# Patient Record
Sex: Female | Born: 1990 | Race: White | Hispanic: No | Marital: Single | Smoking: Former smoker
Health system: Southern US, Community
[De-identification: ages and names within clinical notes are randomized; demographics above are authoritative.]

## PROBLEM LIST (undated history)

## (undated) DIAGNOSIS — G039 Meningitis, unspecified: Secondary | ICD-10-CM

## (undated) HISTORY — PX: CHOLECYSTECTOMY: SHX55

## (undated) HISTORY — PX: TONSILLECTOMY: SUR1361

---
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
## 2010-01-10 ENCOUNTER — Emergency Department (HOSPITAL_COMMUNITY): Admission: EM | Admit: 2010-01-10 | Discharge: 2010-01-10 | Payer: Self-pay | Admitting: Emergency Medicine

## 2011-03-01 LAB — DIFFERENTIAL
Basophils Absolute: 0 10*3/uL (ref 0.0–0.1)
Basophils Relative: 0 % (ref 0–1)
Eosinophils Absolute: 0 10*3/uL (ref 0.0–0.7)
Eosinophils Relative: 0 % (ref 0–5)
Monocytes Absolute: 0.3 10*3/uL (ref 0.1–1.0)

## 2011-03-01 LAB — CBC
HCT: 38.3 % (ref 36.0–46.0)
Hemoglobin: 13.4 g/dL (ref 12.0–15.0)
MCHC: 34.8 g/dL (ref 30.0–36.0)
MCV: 87.6 fL (ref 78.0–100.0)
RDW: 12.1 % (ref 11.5–15.5)

## 2011-03-01 LAB — URINALYSIS, ROUTINE W REFLEX MICROSCOPIC
Bilirubin Urine: NEGATIVE
Hgb urine dipstick: NEGATIVE
Specific Gravity, Urine: 1.015 (ref 1.005–1.030)
Urobilinogen, UA: 0.2 mg/dL (ref 0.0–1.0)

## 2011-03-01 LAB — BASIC METABOLIC PANEL
CO2: 29 mEq/L (ref 19–32)
Calcium: 8.8 mg/dL (ref 8.4–10.5)
Glucose, Bld: 107 mg/dL — ABNORMAL HIGH (ref 70–99)
Potassium: 4.1 mEq/L (ref 3.5–5.1)
Sodium: 136 mEq/L (ref 135–145)

## 2011-05-01 NOTE — Op Note (Signed)
Brittany [name], Brittany [name]               ACCOUNT NO.:  [id number]   MEDICAL RECORD NO.:  [id number]            PATIENT TYPE:   LOCATION:                                 FACILITY:   PHYSICIAN:  Barbaraann Barthel, M.D.      DATE OF BIRTH:   DATE OF PROCEDURE:  10/03/2004  DATE OF DISCHARGE:                                 OPERATIVE REPORT   PREOPERATIVE DIAGNOSIS:  Cholecystitis secondary to biliary dyskinesia.   POSTOPERATIVE DIAGNOSIS:  Cholecystitis secondary to biliary dyskinesia.   PROCEDURE:  Laparoscopic cholecystectomy.   SURGEON:  Marlane Hatcher, M.D.   SPECIMEN:  Gallbladder.   NOTE:  This is a 20 year old white female who had recurrent episodes of  postprandial right upper quadrant pain radiating to her back accompanied  with nausea and vomiting.  She was seen by the GI service at [facility] which revealed no cholelithiasis and a hepatobiliary scan  which suggested biliary dyskinesia she was referred to surgery for a  laparoscopic cholecystectomy.  Plans are to follow up with the GI service at  [facility] for upper GI endoscopy should she continue  to have dyspepsia.   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  The right upper quadrant otherwise appeared to be normal.  A  small cystic duct which was not cannulated for a cholangiogram.  Right upper  quadrant otherwise appeared to be normal.   TECHNIQUE:  The patient was placed in the supine position and after the  adequate administration of general anesthesia via endotracheal intubation  her entire abdomen was prepped with Betadine solution and draped in the  usual manner.  Prior to this a Foley catheter was aseptically inserted.  A  transverse incision was carried out over the umbilicus and the fascia was  grasped with a sharp towel clip and elevated.  A Veress  needle was inserted  after confirmed in position with a saline drop test.  The abdomen was then  insufflated with approximately 3.5 liters of CO2; then an 11-mm [place] [employer]  cannula was placed on the umbilicus incision using the Vis-A-Port technique.   Then, under direct vision, 3 other cannulas an 11-mm cannula in the  epigastrium and two 5-mm cannulas in right upper quadrant laterally.  The  gallbladder was grasped.  Its adhesions were taken down.  The cystic duct  was clearly visualized, triply silver clipped on the side of the common bile  duct, and singly silver clipped on the side of the gallbladder.  The cystic  artery was likewise triply silver clipped and divided.  The gallbladder was  then removed uneventfully from the hepatic bed using the hook cautery  device.  This was no spillage and there were no complications.   The gallbladder bed was lightly cauterized.  I elected to leave a piece of  Surgicel in the liver bed as well.  The gallbladder was then exited using an  EndoCatch device through the epigastric incision.  After irrigating and  checking for hemostasis the abdomen was then desufflated and the fascia was  closed in the area of the umbilicus using #0 Polysorb sutures and all skin  incisions were closed using the stapling device.   Prior to closure, all sponge, needle, and instrument counts were found to be  correct.  Estimated blood loss was minimal.  I placed approximately 1/2%  Sensorcaine around the port sites for added comfort.  There were no  complications.     Will   WB/MEDQ  D:  10/03/2004  T:  10/03/2004  Job:  [id number]   cc:   Barbaraann Barthel, M.D.  [address]  [place]  [place] [address]  Fax: [phone]   Scott A. Gerda Diss, MD  [address]  [place]  [place] [address]  Fax: [phone]

## 2011-05-01 NOTE — Op Note (Signed)
[name], [name]               ACCOUNT NO.:  [id number]   MEDICAL RECORD NO.:  [id number]          PATIENT TYPE:  OIB   LOCATION:  2899                         FACILITY:  [facility]   PHYSICIAN:  Jon Gills, M.D.  DATE OF BIRTH:  [birth year]   DATE OF PROCEDURE:  10/27/2004  DATE OF DISCHARGE:  10/24/2004                                 OPERATIVE REPORT   PREOPERATIVE DIAGNOSIS:  Hematemesis and abdominal pain.   POSTOPERATIVE DIAGNOSIS:  Hematemesis and abdominal pain.   OPERATION PERFORMED:  Upper GI endoscopy with biopsies.   SURGEON:  Jon Gills, M.D.   ASSISTANT:  None.   ANESTHESIA:  General.   DESCRIPTION OF PROCEDURE:  Following informed written consent, the patient  was taken to the operating room and placed under general anesthesia with  continuous cardiopulmonary monitoring.  She remained in the supine position  and the Olympus endoscope was advanced by mouth without difficulty.  Examination of the esophagus was essentially unremarkable although the  distal folds were somewhat thickened and at least one linear erosion was  present.  No abnormalities were seen in the stomach and duodenum. Multiple  biopsies were obtained and submitted for histologic review.  A solitary  gastric biopsy was also obtained to look for Helicobacter pylori.  It was  also my impression that the gastric folds were somewhat hyperemic and  edematous but not actively inflamed.  The endoscope was gradually withdrawn  and Takoya was awakened and taken to the recovery room in satisfactory  condition.  She will be released layer today in the company of her mother.   INSTRUMENT USED:  Olympus GIF-140 endoscope with cold biopsy forceps.   SPECIMENS:  Esophagus times three in [place].  Stomach times two in  [place].  Stomach times one for CLO testing.  Duodenum times three in  [place].       JHC/MEDQ  D:  10/27/2004  T:  10/27/2004  Job:  [id number]   cc:   Lorin Picket A. Gerda Diss, MD  [address], [address]  [place]  [place] [address]  Fax: [phone]

## 2011-05-01 NOTE — Discharge Summary (Signed)
NAME:  Brittany [name], Brittany [name]                         ACCOUNT NO.:  [id number]   MEDICAL RECORD NO.:  [id number]                   PATIENT TYPE:  INP   LOCATION:  A316                                 FACILITY:  [facility]   PHYSICIAN:  Scott A. Gerda Diss, M.D.               DATE OF BIRTH:  January 07, 1991   DATE OF ADMISSION:  01/09/2004  DATE OF DISCHARGE:  01/10/2004                                 DISCHARGE SUMMARY   DISCHARGE DIAGNOSIS:  Gastroenteritis.   HOSPITAL COURSE:  The patient was admitted in with abdominal pain and  vomiting with severe discomfort unable to calm the pain.  While in the ER,  he had a CT scan treated with pain medicine still unable to keep anything  down.  Therefore, she was admitted.  She was placed on IV medication for  nausea along with medication for pain and discomfort.  She was covered with  antibiotics.  Urine culture was ordered.  The following day, the patient was  doing better and able to take things well.  Repeat blood count showed  hemoglobin 12.8, white count 9.2.  Urine pregnancy was negative.  X-ray  overall was negative.  The patient was stable for discharge and discharged  to home on January 27, without difficulty.   DISCHARGE MEDICATIONS:  1. Phenergan.  2. Vicodin.   FOLLOW UP:  Follow up in one week and sooner if any problems.     ___________________________________________                                         Jonna Coup Gerda Diss, M.D.   Linus Orn  D:  01/23/2004  T:  01/23/2004  Job:  [id number]

## 2011-05-01 NOTE — H&P (Signed)
Brittany [name], Brittany [name]                         ACCOUNT NO.:  [id number]   MEDICAL RECORD NO.:  [id number]                   PATIENT TYPE:  EMS   LOCATION:  ED                                   FACILITY:  [facility]   PHYSICIAN:  Scott A. Gerda Diss, M.D.               DATE OF BIRTH:  1991/01/08   DATE OF ADMISSION:  01/09/2004  DATE OF DISCHARGE:                                HISTORY & PHYSICAL   CHIEF COMPLAINT:  Abdominal pain.   HISTORY OF PRESENT ILLNESS:  This 20 year old white female who awoke this  morning with severe abdominal pain, vomiting, inability to keep anything  down, severe pain on the right side, moaning, crying in discomfort, came to  the emergency department and was suspected for the possibility of a kidney  stone versus appendicitis versus ruptured cyst.  A CT scan of the abdomen  and pelvis was ordered and the patient was treated with appropriate pain  medication.  Unable to get pain under control with pain medication.  The  patient continued vomiting.  Therefore, we are consulted.   PAST MEDICAL HISTORY:  The patient has had problems with rectal bleeding in  the past.  She was seen by [facility], then seen at [facility] as well.  It  is felt that it is most likely due to a perianal fissure or inflammatory  polyp.  The patient also had an admission back in August 2004 of  gastroenteritis, then spent a day in the hospital.  PMH benign otherwise.   ALLERGIES:  Questionable if TYLENOL caused a rash or vomiting.   FAMILY HISTORY:  Colon cancer, renal cancer, gestational diabetes.   SOCIAL HISTORY:  Unknown if the patient smokes.  She is drowsy from the  medication currently.   PHYSICAL EXAMINATION:  GENERAL:  Looks to feel ill.  HEENT:  T-NL.  Neck is supple.  TMs NL.  CHEST:  CTA.  HEART:  Regular.  ABDOMEN:  Soft with moderate right lower quadrant and right mid lateral  quadrant tenderness.  No guarding nor rebound.  EXTREMITIES:  No edema.  NEUROLOGIC: Grossly  normal.   UA 7-10 wbc's.  White count 8.8.  MET-7 noted.  CT scan normal appearance of  the appendix and kidneys and ureters.  No hydronephrosis or stone seen.  No  GYN masses seen.  Increased enlargement in the mesenteric nodes consistent  with mesenteric adenitis.   ASSESSMENT AND PLAN:  Abdominal pain - most likely a viral process with  mesenteric adenitis.  I doubt kidney stones.  There could be a UTI going on.  We will culture the urine and go ahead and treat with IV antibiotics.  The  patient's pain is unable to be controlled and therefore we will go ahead and  admit the patient for IV fluids and pain control.  Expect 1-3 days in the  hospital.     ___________________________________________  Scott A. Gerda Diss, M.D.   Linus Orn  D:  01/09/2004  T:  01/09/2004  Job:  [id number]

## 2011-05-01 NOTE — Procedures (Signed)
REFERRING PHYSICIAN:  Scott A. Gerda Diss, M.D.   CLINICAL HISTORY:  A 21 year old girl with episode of passing out. EEG is  being evaluated for seizure activity.   MEDICATIONS:  1. Darvocet.  2. Doxycycline.  3. Birth control pills.   This is a routine EEG recording performed using 17-channel and standard  10/20 electrode placement.   Background rhythm consists of 9 to 10 hertz alpha which was of moderate  amplitude, reactive, synchronous to eye opening and closure. Moderate amount  of low amplitude high frequency beta activity is seen throughout the  recording. No definite epileptiform activity spikes or sharp waves are  noted. Hyperventilation resulted in no significant abnormalities.  Intermittent photic stimulation is unremarkable. Sleep stages are not seen  except for mild drowsiness which is uneventful. Length of the tracing is 22  minutes, 11 seconds. Technical component is adequate. EKG tracing reveals  regular rate and rhythm.   IMPRESSION:  Procedure performed during wakeful state and drowsiness is  within normal limits. No definite epileptiform is identified. If seizures  are suspected, repeat sleep-deprived study may be beneficial.    PRAMOD Lina Sayre, MD   EAV:[id number]  D:  07/31/2004 18:00:23  T:  08/01/2004 15:21:56  Job #:  [id number]

## 2011-05-01 NOTE — Discharge Summary (Signed)
   NAME:  Brittany [name], Brittany [name]                         ACCOUNT NO.:  [id number]   MEDICAL RECORD NO.:  [id number]                   PATIENT TYPE:  OBV   LOCATION:  A328                                 FACILITY:  [facility]   PHYSICIAN:  Scott A. Gerda Diss, M.D.               DATE OF BIRTH:  [birth year]   DATE OF ADMISSION:  07/31/2003  DATE OF DISCHARGE:  08/01/2003                                 DISCHARGE SUMMARY   CHIEF COMPLAINT:  Nausea, vomiting and diarrhea.   HISTORY OF PRESENT ILLNESS:  This 20 year old, white female was admitted  into the hospital after failing outpatient management.  She was seen the day  previous to coming in on August 16, and she was treated at that time with  Phenergan.  The patient was unable to take any particular medications at  that time because of unable to get the prescription filled.  As a result of  this, the patient was then treated with Phenergan here in the hospital along  with IV fluids and the patient improved gradually over the course of the  next 48 hours.  In addition to this, on the morning of August 18, the  patient was able to keep liquids down, dry cereal and toast.  It was felt  that the patient was stable for discharge.  Her repeat blood work overall  looked good.  The patient did not have any particular troubles otherwise.  There were no fevers.  Exams at both admission and discharge were overall  acceptable with an improvement in the abdominal discomfort at the time of  discharge.   PHYSICAL EXAMINATION:  HEENT:  Benign.  TMs are benign.  Mucous membranes  tacky.  NECK:  Supple.  LUNGS:  Clear.  HEART:  Regular.  ABDOMEN:  Soft, positive bowel sounds.  VITAL SIGNS:  Positive orthostasis in the office with systolic dropping 20  points.  EXTREMITIES:  No edema.  NEUROLOGIC:  Grossly normal.   LABORATORY DATA AND X-RAY FINDINGS:  On admission, laboratory showed a  sodium of 133 and on discharge 135, BUN, creatinine and bicarb were  acceptable.  White count normal.   ASSESSMENT:  The patient has viral gastroenteritis and should gradually  improve, but she is at the point now where she can be discharged from  observation.                                               Scott A. Gerda Diss, M.D.    SAL/MEDQ  D:  08/01/2003  T:  08/01/2003  Job:  [id number]

## 2011-05-01 NOTE — Discharge Summary (Signed)
NAME:  Brittany [name], Brittany [name]                         ACCOUNT NO.:  [id number]   MEDICAL RECORD NO.:  [id number]                   PATIENT TYPE:  INP   LOCATION:  A315                                 FACILITY:  [facility]   PHYSICIAN:  Scott A. Gerda Diss, M.D.               DATE OF BIRTH:  December 02, 1991   DATE OF ADMISSION:  07/26/2004  DATE OF DISCHARGE:  07/29/2004                                 DISCHARGE SUMMARY   DISCHARGE DIAGNOSIS:  Aseptic meningitis, probably viral.   HISTORY OF PRESENT ILLNESS AND HOSPITAL COURSE:  This 20 year old white  female was admitted in with a one-day history of dizziness, severe headache,  nuchal rigidity and was evaluated properly in the emergency department.  She  had an LP done which did not show any RBCs but did show 22 WBCs.  Her  glucose and protein counts were normal.  Because of the severity of her  headache and WBCs seen, she was admitted in an placed on IV antibiotics  while all of this was pending.  Cultures came back showing no growth of  bacteria, but because of persistent problems for the patient, she was  continued on pain medication all the way through the morning of the 16th.  She did have a little bit of a rash that occurred after receiving some  morphine and receiving some Rocephin.  It was hard to know which one caused  it, so both of them were stopped.  She was switched over to Dilaudid.  Vancomycin was tolerated without problem, but the Dilaudid may have caused a  rash too.  None of these were severe rashes, none were hives.  I recommended  for the patient to just stay away from those type of medications, but  somewhere down the road she would have to be watched closely if she ever had  surgery or received pain medicine.   DISCHARGE MEDICATIONS:  1. Darvocet one q.4h. p.r.n. pain.  Caution drowsiness.  2. Doxycycline 100 mg, one b.i.d. for seven days.   FOLLOW UP:  She is to follow up in the office in one week.  She is to have  an EEG as an  outpatient at [facility] on 07/31/2004 at 8 a.m.     ___________________________________________                                         Lorin Picket A. Gerda Diss, M.D.   SAL/MEDQ  D:  07/29/2004  T:  07/29/2004  Job:  [id number]

## 2011-05-01 NOTE — H&P (Signed)
NAMEPEARLIE, [name]                         ACCOUNT NO.:  [id number]   MEDICAL RECORD NO.:  [id number]                   PATIENT TYPE:  INP   LOCATION:  A315                                 FACILITY:  [facility]   PHYSICIAN:  Jeoffrey Massed, M.D.             DATE OF BIRTH:  Aug 07, 1991   DATE OF ADMISSION:  07/26/2004  DATE OF DISCHARGE:                                HISTORY & PHYSICAL   CHIEF COMPLAINT:  Passing out and headache.   HISTORY OF PRESENT ILLNESS:  Brittany [name] is a 20 year old white female who was  brought to the emergency department today about 3:00 o'clock after she  passed out at home. She initially began feeling a headache the night prior  to presentation that started in the back of her head and then spread to  cover her entire head within about 5 minutes. It was 8 out of 10 in  intensity and was not associated with any abnormal visual or neurologic  sensations, nausea, or photophobia. She had trouble going to sleep but  eventually did get to sleep and when she woke up, she did not have a  headache. She did note that all day today, she had been feeling dizzy and  off balance and nauseous. About 2:00 p.m. today, while in the kitchen, she  noted worsening of her nausea and dizziness and felt like things were going  in slow motion and noted that her heartbeat was fast and that is the last  thing that she remembers. Apparently her younger cousin witnessed her fall  and passing out but the cousin is not here for interview. The cousin called  the patient's mother and the mother arrived within 5 minutes of the passing  out. Apparently the cousin who witnessed the event said that Nashaly's  eyelids were fluttering and that her eyes were rolled straight back and she  was not breathing for 30 seconds. By the time the mother arrived, after  about 5 minutes, Orville apparently had her eyes wide open but would not  respond to her voice for approximately 10 to 15 minutes. When she  did  respond, eventually she was dazed and disoriented and kept insisting that  she was very hot. She then remembers waking up in the ambulance with a  severe headache, with photophobia and dizziness but no nausea. In the  emergency department, she underwent a lumbar puncture and this revealed 20  white blood cells and no red blood cells. I was called for further  evaluation and admission to the hospital for possible meningitis.   REVIEW OF SYSTEMS:  Two days ago, had a couple of episodes of emesis, which  apparently is not an entirely abnormal thing for her. No recent fever. No  recent foreign travel. NO recent tic bites. NO rash. No sore throat, upper  respiratory infection, cough, or abdominal pain. No swelling of extremities  and no vision or hearing changes.  She did have a period of chest pain and  shortness of breath in the emergency department, which apparently was short  lived.   PAST MEDICAL HISTORY:  Of note, denies any significant past medical history  of headaches. She has history of chronic intermittent abdominal pain with  vomiting, which has required hospitalization and rehydration in the past.  Abdominal and pelvic CT has been negative in the past. Ultrasound of the  abdomen has also been negative in the past. 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 She sees  [facility] for this.   PAST SURGICAL HISTORY:  Tonsillectomy and adenoidectomy in 2002.   MEDICATIONS:  1. Ortho-Tri-Cyclen low.  2. Vicodin 5/500 p.r.n. back pain.   ALLERGIES:  No known drug allergies.   SOCIAL HISTORY:  She lives in [place] with her mother and 83 year old  sister. She goes to [employer] and her biggest event this  summer has been going to [employer] in July. Her parents are divorced and her  dad lives in [place]. None of her family has been sick recently, with the  exception of her mother's cousin who died of  meningitis 3 weeks ago at age  60. Ryllie does not use tobacco, alcohol, or drugs.   FAMILY HISTORY:  A 46 year old sister with a seizure disorder and a first  cousin with a seizure disorder. No migraine headaches.   PHYSICAL EXAMINATION:  VITAL SIGNS:  Temperature in the emergency department  97.1 to 98.3 orally. Pulse 81 to 96. Blood pressure 108 to 127 over 64 to  78. Respiratory rate 18 and O2 saturation 96% to 98% on room air.  GENERAL:  On my examination, she is sedated but arousable. She is oriented  to person, place, and time and situation. She answers questions clearly. She  is in no acute distress. She is lying in a darkened examination room with a  wet towel over her forehead.  HEENT:  Pupils are equal and reactive to light and accommodation. Red  reflexes are symmetric. Extraocular movements are intact. Tympanic membranes  reveal light reflex and landmarks bilaterally. Nasal passages are patent.  Oropharynx reveals pink, moist mucosa without lesion, exudate or swelling.  The eyes are without icterus or injection or drainage. The patient has  tongue piercing.  NECK:  Supple without lymphadenopathy or thyromegaly. There is mild  tenderness in the occipital region, diffusely. There is no nuchal rigidity.  CHEST:  Clear to auscultation bilaterally. Breathing non-labored.  CARDIOVASCULAR:  Examination shows regular rate and rhythm without murmur,  rub, or gallop.  ABDOMEN:  Soft. Mildly tender diffusely without guarding or rebound. Bowel  sounds are normal active. There is no hepatosplenomegaly. No mass. She does  have a piercing in her umbilicus.  EXTREMITIES:  No clubbing, cyanosis, or edema.  SKIN:  No rash.  NEUROLOGIC:  Funduscopic examination:  The patient could not tolerate, but I  did glimpse some normal appearing vasculature on the retina bilaterally.  Optic disks could not be appreciated secondary to patient's non-compliance with maneuver. Cranial nerves 2-12 are  grossly intact bilaterally. Upper  extremity and lower extremity strength 5 out of 5 proximally and distally  bilaterally. No sensory deficits. Trace reflexes elicited bilaterally in the  biceps, triceps, patellar and Achilles regions. The patient would not  cooperate for cerebellar testing.   LABORATORY DATA:  CBC showed a white blood cell count of 5.2, hemoglobin  12.4, platelets  285,000. Differential showed 60% neutrophils, 30%  lymphocytes, 8% monocytes. PT was 12.5, INR 0.9. A basic metabolic panel  showed sodium 045, potassium 4.1, chloride 106, bicarb 26, BUN 6, creatinine  0.6, glucose 129, calcium 8.6. Cerebrospinal fluid studies showed glucose  59, cerebrospinal fluid protein 29. Cell count showed 0 red blood cells, 22  white blood cells, with no differential performed and there was no organism  seen on gram stain. The fluid was clear and colorless.   ASSESSMENT/PLAN:  Severe headache:  Possibilities include meningitis,  migraine headache, or increased intracranial pressure associated with mass.  Given the history, it is difficult to clearly say that this is a headache  associated with infection or the meninges. However, will treat with  antibiotics until the cultures are negative for at least 48 hours.  Additionally, given her syncopal episode and reported seizure-like activity  with confusion afterwards, I also feel like we should look into the  possibility of a seizure disorder. Will obtain  electroencephalogram and am presently considering neurologic imaging and  neurologic consultation. Will start morphine for pain, Phenergan for nausea  and give maintenance intravenous fluids and monitor for changes. For  antibiotics, will give Rocephin at 2 mg q. 12 hours for meningitic doses.     ___________________________________________                                         Jeoffrey Massed, M.D.   PHM/MEDQ  D:  07/26/2004  T:  07/27/2004  Job:  [id number]

## 2014-12-28 ENCOUNTER — Emergency Department (HOSPITAL_COMMUNITY)
Admission: EM | Admit: 2014-12-28 | Discharge: 2014-12-28 | Disposition: A | Discharge status: Home / Self Care | Payer: BLUE CROSS/BLUE SHIELD | Attending: Emergency Medicine | Admitting: Emergency Medicine

## 2014-12-28 ENCOUNTER — Emergency Department (HOSPITAL_COMMUNITY): Payer: BLUE CROSS/BLUE SHIELD

## 2014-12-28 ENCOUNTER — Encounter (HOSPITAL_COMMUNITY): Payer: Self-pay | Admitting: *Deleted

## 2014-12-28 DIAGNOSIS — Z720 Tobacco use: Secondary | ICD-10-CM | POA: Diagnosis not present

## 2014-12-28 DIAGNOSIS — Z8669 Personal history of other diseases of the nervous system and sense organs: Secondary | ICD-10-CM | POA: Diagnosis not present

## 2014-12-28 DIAGNOSIS — R55 Syncope and collapse: Secondary | ICD-10-CM | POA: Diagnosis not present

## 2014-12-28 DIAGNOSIS — R51 Headache: Secondary | ICD-10-CM | POA: Insufficient documentation

## 2014-12-28 DIAGNOSIS — R519 Headache, unspecified: Secondary | ICD-10-CM

## 2014-12-28 HISTORY — DX: Meningitis, unspecified: G03.9

## 2014-12-28 LAB — BASIC METABOLIC PANEL
Anion gap: 5 (ref 5–15)
BUN: 6 mg/dL (ref 6–23)
CALCIUM: 8.9 mg/dL (ref 8.4–10.5)
CHLORIDE: 106 meq/L (ref 96–112)
CO2: 28 mmol/L (ref 19–32)
CREATININE: 0.59 mg/dL (ref 0.50–1.10)
Glucose, Bld: 99 mg/dL (ref 70–99)
POTASSIUM: 3.8 mmol/L (ref 3.5–5.1)
SODIUM: 139 mmol/L (ref 135–145)

## 2014-12-28 LAB — CBC WITH DIFFERENTIAL/PLATELET
BASOS ABS: 0 10*3/uL (ref 0.0–0.1)
BASOS PCT: 0 % (ref 0–1)
EOS PCT: 1 % (ref 0–5)
Eosinophils Absolute: 0.2 10*3/uL (ref 0.0–0.7)
HEMATOCRIT: 43.2 % (ref 36.0–46.0)
Hemoglobin: 14 g/dL (ref 12.0–15.0)
LYMPHS ABS: 2.4 10*3/uL (ref 0.7–4.0)
Lymphocytes Relative: 17 % (ref 12–46)
MCH: 29.5 pg (ref 26.0–34.0)
MCHC: 32.4 g/dL (ref 30.0–36.0)
MCV: 91.1 fL (ref 78.0–100.0)
MONOS PCT: 4 % (ref 3–12)
Monocytes Absolute: 0.6 10*3/uL (ref 0.1–1.0)
NEUTROS ABS: 11.2 10*3/uL — AB (ref 1.7–7.7)
NEUTROS PCT: 78 % — AB (ref 43–77)
Platelets: 304 10*3/uL (ref 150–400)
RBC: 4.74 MIL/uL (ref 3.87–5.11)
RDW: 12.4 % (ref 11.5–15.5)
WBC: 14.4 10*3/uL — ABNORMAL HIGH (ref 4.0–10.5)

## 2014-12-28 MED ORDER — SODIUM CHLORIDE 0.9 % IV BOLUS (SEPSIS)
2000.0000 mL | Freq: Once | INTRAVENOUS | Status: AC
  Administered 2014-12-28: 1000 mL via INTRAVENOUS

## 2014-12-28 MED ORDER — METOCLOPRAMIDE HCL 5 MG/ML IJ SOLN
10.0000 mg | Freq: Once | INTRAMUSCULAR | Status: AC
  Administered 2014-12-28: 10 mg via INTRAVENOUS
  Filled 2014-12-28: qty 2

## 2014-12-28 MED ORDER — DIPHENHYDRAMINE HCL 50 MG/ML IJ SOLN
25.0000 mg | Freq: Once | INTRAMUSCULAR | Status: AC
  Administered 2014-12-28: 25 mg via INTRAVENOUS
  Filled 2014-12-28: qty 1

## 2014-12-28 NOTE — ED Provider Notes (Signed)
CSN: [id number]     Arrival date & time 12/28/14  1914 History   First MD Initiated Contact with Patient 12/28/14 1933     Chief Complaint  Patient presents with  . Loss of Consciousness  . Headache     (Consider location/radiation/quality/duration/timing/severity/associated sxs/prior Treatment) HPI  24 year old female presents after passing out at work. This morning around 9 AM the patient gave plasma. She has done this before. She was at work at [employer] and was at [employer] and felt like her whole body was getting hot, the world was going black, and her legs became weak. According to witnesses she fell to her knees but did not injure herself. She did seem to briefly pass out although did not hit her head. Since she woke up the patient has had a severe frontal headache. It feels like someone is sitting on her head. She's also having photophobia. Feels diffusely weak, no focal weakness noted. The patient has no neck stiffness. The patient never felt chest pain or shortness of breath. No headache prior to passing out. Rates the pain as severe. No abdominal pain or urinary symptoms. 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  Past Medical History  Diagnosis Date  . Meningitis    Past Surgical History  Procedure Laterality Date  . Cholecystectomy    . Tonsillectomy     Family History  Problem Relation Age of Onset  . Heart attack Sister   . Heart attack Maternal Aunt    History  Substance Use Topics  . Smoking status: Current Some Day Smoker  . Smokeless tobacco: Not on file  . Alcohol Use: Yes     Comment: socially   OB History    No data available     Review of Systems  Constitutional: Negative for fever.  Eyes: Positive for photophobia.  Respiratory: Negative for shortness of breath.   Cardiovascular: Negative for chest pain.  Gastrointestinal: Negative for vomiting and abdominal pain.   Genitourinary: Negative for dysuria.  Neurological: Positive for syncope, weakness and headaches.  All other systems reviewed and are negative.     Allergies  Doxycycline  Home Medications   Prior to Admission medications   Not on File   BP 119/63 mmHg  Pulse 87  Temp(Src) 98.2 F (36.8 C) (Oral)  Resp 16  Ht  (1.626 m)  Wt 184 lb (83.462 kg)  BMI 31.57 kg/m2  SpO2 97%  LMP 12/17/2014 Physical Exam  Constitutional: She is oriented to person, place, and time. She appears well-developed and well-nourished.  HENT:  Head: Normocephalic and atraumatic.  Right Ear: External ear normal.  Left Ear: External ear normal.  Nose: Nose normal.  Eyes: EOM are normal. Pupils are equal, round, and reactive to light. Right eye exhibits no discharge. Left eye exhibits no discharge.  photophobic  Neck: Normal range of motion. Neck supple.  Normal Passive ROM without pain or stiffness  Cardiovascular: Normal rate, regular rhythm and normal heart sounds.   Pulmonary/Chest: Effort normal and breath sounds normal.  Abdominal: Soft. She exhibits no distension. There is no tenderness.  Neurological: She is alert and oriented to person, place, and time.  CN 2-12 grossly intact. 5/5 strength in all 4 extremities  Skin: Skin is warm and dry.  Nursing note and vitals reviewed.   ED Course  Procedures (including critical care time) Labs Review Labs Reviewed  CBC WITH DIFFERENTIAL -  Abnormal; Notable for the following:    WBC 14.4 (*)    Neutrophils Relative % 78 (*)    Neutro Abs 11.2 (*)    All other components within normal limits  BASIC METABOLIC PANEL    Imaging Review Ct Head Wo Contrast  12/28/2014   CLINICAL DATA:  Headache, loss of consciousness.  EXAM: CT HEAD WITHOUT CONTRAST  TECHNIQUE: Contiguous axial images were obtained from the base of the skull through the vertex without intravenous contrast.  COMPARISON:  None.  FINDINGS: No intracranial hemorrhage, mass effect,  or midline shift. No hydrocephalus. The basilar cisterns are patent. No evidence of territorial infarct. No intracranial fluid collection. Mild patient motion artifact. Calvarium is intact. Included paranasal sinuses and mastoid air cells are well aerated.  IMPRESSION: No acute intracranial abnormality.   Electronically Signed   By: Rubye OaksMelanie  Ehinger M.D.   On: 12/28/2014 20:48     EKG Interpretation   Date/Time:  Friday December 28 2014 19:40:31 EST Ventricular Rate:  88 PR Interval:  139 QRS Duration: 84 QT Interval:  373 QTC Calculation: 451 R Axis:   66 Text Interpretation:  Normal sinus rhythm Normal ECG No old tracing to  compare Confirmed by Brynlyn Dade  MD, Cedar Roseman ([id number]) on 12/28/2014 8:24:11 PM      MDM   Final diagnoses:  Syncope, unspecified syncope type  Acute nonintractable headache, unspecified headache type    Patient symptoms significant improved with fluids and pain medicines. Given her severe headache after syncope a CT was obtained and is negative. Given this was obtained approximately 3 hours after the syncopal episode and doubt subarachnoid hemorrhage. She feels significant improved and is up and feels well enough to be discharged. After the workup her mother endorse that she also gave plasma couple days ago, likely this is all coming from dehydration. I've encouraged her to be more cautious with plasma donation as well as drinking clear fluids.    Audree CamelScott T Diem Dicocco, MD 12/28/14 2126

## 2014-12-28 NOTE — ED Notes (Signed)
Discharge instructions given, pt demonstrated teach back and verbal understanding. No concerns voiced.  

## 2014-12-28 NOTE — ED Notes (Signed)
Patient passed out at work tonight.  No sick contacts.  Denies cough, congestion, n/v/d.  Donated plasma about 0900 today. Last food and drink 1500, water at 1800.  Reports she did not hit her head.

## 2015-12-22 ENCOUNTER — Encounter (HOSPITAL_COMMUNITY): Payer: Self-pay | Admitting: *Deleted

## 2015-12-22 ENCOUNTER — Emergency Department (HOSPITAL_COMMUNITY)
Admission: EM | Admit: 2015-12-22 | Discharge: 2015-12-23 | Disposition: A | Discharge status: Home / Self Care | Payer: BLUE CROSS/BLUE SHIELD | Attending: Emergency Medicine | Admitting: Emergency Medicine

## 2015-12-22 DIAGNOSIS — Z8661 Personal history of infections of the central nervous system: Secondary | ICD-10-CM | POA: Insufficient documentation

## 2015-12-22 DIAGNOSIS — Y998 Other external cause status: Secondary | ICD-10-CM | POA: Insufficient documentation

## 2015-12-22 DIAGNOSIS — S46812A Strain of other muscles, fascia and tendons at shoulder and upper arm level, left arm, initial encounter: Secondary | ICD-10-CM | POA: Insufficient documentation

## 2015-12-22 DIAGNOSIS — W000XXA Fall on same level due to ice and snow, initial encounter: Secondary | ICD-10-CM | POA: Insufficient documentation

## 2015-12-22 DIAGNOSIS — S20212A Contusion of left front wall of thorax, initial encounter: Secondary | ICD-10-CM | POA: Insufficient documentation

## 2015-12-22 DIAGNOSIS — Z87891 Personal history of nicotine dependence: Secondary | ICD-10-CM | POA: Insufficient documentation

## 2015-12-22 DIAGNOSIS — S40022A Contusion of left upper arm, initial encounter: Secondary | ICD-10-CM

## 2015-12-22 DIAGNOSIS — S40012A Contusion of left shoulder, initial encounter: Secondary | ICD-10-CM | POA: Insufficient documentation

## 2015-12-22 DIAGNOSIS — Y9301 Activity, walking, marching and hiking: Secondary | ICD-10-CM | POA: Insufficient documentation

## 2015-12-22 DIAGNOSIS — S5002XA Contusion of left elbow, initial encounter: Secondary | ICD-10-CM | POA: Insufficient documentation

## 2015-12-22 DIAGNOSIS — Y92014 Private driveway to single-family (private) house as the place of occurrence of the external cause: Secondary | ICD-10-CM | POA: Insufficient documentation

## 2015-12-22 DIAGNOSIS — W009XXA Unspecified fall due to ice and snow, initial encounter: Secondary | ICD-10-CM

## 2015-12-22 NOTE — ED Notes (Signed)
Pt c/o pain to left arm and back after falling earlier today

## 2015-12-23 ENCOUNTER — Emergency Department (HOSPITAL_COMMUNITY): Payer: BLUE CROSS/BLUE SHIELD

## 2015-12-23 ENCOUNTER — Other Ambulatory Visit (HOSPITAL_COMMUNITY): Payer: BLUE CROSS/BLUE SHIELD

## 2015-12-23 MED ORDER — CYCLOBENZAPRINE HCL 10 MG PO TABS
10.0000 mg | ORAL_TABLET | Freq: Once | ORAL | Status: AC
  Administered 2015-12-23: 10 mg via ORAL
  Filled 2015-12-23: qty 1

## 2015-12-23 MED ORDER — IBUPROFEN 400 MG PO TABS
ORAL_TABLET | ORAL | Status: AC
  Filled 2015-12-23: qty 1

## 2015-12-23 MED ORDER — NAPROXEN 500 MG PO TABS: ORAL_TABLET | ORAL | Status: DC

## 2015-12-23 MED ORDER — IBUPROFEN 400 MG PO TABS
600.0000 mg | ORAL_TABLET | Freq: Once | ORAL | Status: AC
  Administered 2015-12-23: 600 mg via ORAL
  Filled 2015-12-23: qty 2

## 2015-12-23 MED ORDER — CYCLOBENZAPRINE HCL 5 MG PO TABS: 5.0000 mg | ORAL_TABLET | Freq: Three times a day (TID) | ORAL | Status: DC | PRN

## 2015-12-23 NOTE — Discharge Instructions (Signed)
Use ice to the areas that are painful. Take the medications as prescribed. Recheck if still painful in the next week by your orthopedist or Dr Romeo AppleHarrison, the orthopedist on call. Call the office to make an appointment.    Cryotherapy Cryotherapy means treatment with cold. Ice or gel packs can be used to reduce both pain and swelling. Ice is the most helpful within the first 24 to 48 hours after an injury or flare-up from overusing a muscle or joint. Sprains, strains, spasms, burning pain, shooting pain, and aches can all be eased with ice. Ice can also be used when recovering from surgery. Ice is effective, has very few side effects, and is safe for most people to use. PRECAUTIONS  Ice is not a safe treatment option for people with:  Raynaud phenomenon. This is a condition affecting small blood vessels in the extremities. Exposure to cold may cause your problems to return.  Cold hypersensitivity. There are many forms of cold hypersensitivity, including:  Cold urticaria. Red, itchy hives appear on the skin when the tissues begin to warm after being iced.  Cold erythema. This is a red, itchy rash caused by exposure to cold.  Cold hemoglobinuria. Red blood cells break down when the tissues begin to warm after being iced. The hemoglobin that carry oxygen are passed into the urine because they cannot combine with blood proteins fast enough.  Numbness or altered sensitivity in the area being iced. If you have any of the following conditions, do not use ice until you have discussed cryotherapy with your caregiver:  Heart conditions, such as arrhythmia, angina, or chronic heart disease.  High blood pressure.  Healing wounds or open skin in the area being iced.  Current infections.  Rheumatoid arthritis.  Poor circulation.  Diabetes. Ice slows the blood flow in the region it is applied. This is beneficial when trying to stop inflamed tissues from spreading irritating chemicals to surrounding  tissues. However, if you expose your skin to cold temperatures for too long or without the proper protection, you can damage your skin or nerves. Watch for signs of skin damage due to cold. HOME CARE INSTRUCTIONS Follow these tips to use ice and cold packs safely.  Place a dry or damp towel between the ice and skin. A damp towel will cool the skin more quickly, so you may need to shorten the time that the ice is used.  For a more rapid response, add gentle compression to the ice.  Ice for no more than 10 to 20 minutes at a time. The bonier the area you are icing, the less time it will take to get the benefits of ice.  Check your skin after 5 minutes to make sure there are no signs of a poor response to cold or skin damage.  Rest 20 minutes or more between uses.  Once your skin is numb, you can end your treatment. You can test numbness by very lightly touching your skin. The touch should be so light that you do not see the skin dimple from the pressure of your fingertip. When using ice, most people will feel these normal sensations in this order: cold, burning, aching, and numbness.  Do not use ice on someone who cannot communicate their responses to pain, such as small children or people with dementia. HOW TO MAKE AN ICE PACK Ice packs are the most common way to use ice therapy. Other methods include ice massage, ice baths, and cryosprays. Muscle creams that cause a  cold, tingly feeling do not offer the same benefits that ice offers and should not be used as a substitute unless recommended by your caregiver. To make an ice pack, do one of the following:  Place crushed ice or a bag of frozen vegetables in a sealable plastic bag. Squeeze out the excess air. Place this bag inside another plastic bag. Slide the bag into a pillowcase or place a damp towel between your skin and the bag.  Mix 3 parts water with 1 part rubbing alcohol. Freeze the mixture in a sealable plastic bag. When you remove the  mixture from the freezer, it will be slushy. Squeeze out the excess air. Place this bag inside another plastic bag. Slide the bag into a pillowcase or place a damp towel between your skin and the bag. SEEK MEDICAL CARE IF:  You develop white spots on your skin. This may give the skin a blotchy (mottled) appearance.  Your skin turns blue or pale.  Your skin becomes waxy or hard.  Your swelling gets worse. MAKE SURE YOU:   Understand these instructions.  Will watch your condition.  Will get help right away if you are not doing well or get worse.   This information is not intended to replace advice given to you by your health care provider. Make sure you discuss any questions you have with your health care provider.   Document Released: 07/27/2011 Document Revised: 12/21/2014 Document Reviewed: 07/27/2011 Elsevier Interactive Patient Education 2016 [employer]  Chest Contusion A contusion is a deep bruise. Bruises happen when an injury causes bleeding under the skin. Signs of bruising include pain, puffiness (swelling), and discolored skin. The bruise may turn blue, purple, or yellow.  HOME CARE  Put ice on the injured area.  Put ice in a plastic bag.  Place a towel between the skin and the bag.  Leave the ice on for 15-20 minutes at a time, 03-04 times a day for the first 48 hours.  Only take medicine as told by your doctor.  Rest.  Take deep breaths (deep-breathing exercises) as told by your doctor.  Stop smoking if you smoke.  Do not lift objects over 5 pounds (2.3 kilograms) for 3 days or longer if told by your doctor. GET HELP RIGHT AWAY IF:   You have more bruising or puffiness.  You have pain that gets worse.  You have trouble breathing.  You are dizzy, weak, or pass out (faint).  You have blood in your pee (urine) or poop (stool).  You cough up or throw up (vomit) blood.  Your puffiness or pain is not helped with medicines. MAKE SURE YOU:    Understand these instructions.  Will watch your condition.  Will get help right away if you are not doing well or get worse.   This information is not intended to replace advice given to you by your health care provider. Make sure you discuss any questions you have with your health care provider.   Document Released: 05/18/2008 Document Revised: 08/24/2012 Document Reviewed: 05/23/2012 Elsevier Interactive Patient Education 2016 [employer] Inc[employer]  Elbow Contusion  An elbow contusion is a deep bruise of the elbow. Contusions happen when an injury causes bleeding under the skin. Signs of bruising include pain, puffiness (swelling), and discolored skin. The contusion may turn blue, purple, or yellow. HOME CARE  Put ice on the injured area.  Put ice in a plastic bag.  Place a towel between your skin and the bag.  Leave  the ice on for 15-20 minutes, 03-04 times a day.  Only take medicines as told by your doctor.  Rest your elbow until the pain and puffiness are better.  Raise (elevate) your elbow to lessen puffiness.  Put on an elastic wrap as told by your doctor. You can take it off for sleeping, showers, and baths. If your fingers get cold, blue, or lose feeling (numb), take the wrap off. Put the wrap back on more loosely.  Use your elbow only as told by your doctor. If you are asked to do elbow exercises, do them as told.  Keep all doctor visits as told. GET HELP RIGHT AWAY IF:  You have more redness, puffiness, or pain in your elbow.  Your puffiness or pain is not helped by medicines.  You have puffiness of the hand and fingers.  You are not able to move your fingers or wrist.  You start to lose feeling in your hand or fingers.  Your fingers or hand become cold or blue. MAKE SURE YOU:   Understand these instructions.  Will watch your condition.  Will get help right away if you are not doing well or get worse.   This information is not intended to replace advice  given to you by your health care provider. Make sure you discuss any questions you have with your health care provider.   Document Released: 11/19/2011 Document Revised: 05/31/2012 Document Reviewed: 07/15/2015 Elsevier Interactive Patient Education 2016 [employer] Inc.  Cryotherapy Cryotherapy is when you put ice on your injury. Ice helps lessen pain and puffiness (swelling) after an injury. Ice works the best when you start using it in the first 24 to 48 hours after an injury. HOME CARE  Put a dry or damp towel between the ice pack and your skin.  You may press gently on the ice pack.  Leave the ice on for no more than 10 to 20 minutes at a time.  Check your skin after 5 minutes to make sure your skin is okay.  Rest at least 20 minutes between ice pack uses.  Stop using ice when your skin loses feeling (numbness).  Do not use ice on someone who cannot tell you when it hurts. This includes small children and people with memory problems (dementia). GET HELP RIGHT AWAY IF:  You have white spots on your skin.  Your skin turns blue or pale.  Your skin feels waxy or hard.  Your puffiness gets worse. MAKE SURE YOU:   Understand these instructions.  Will watch your condition.  Will get help right away if you are not doing well or get worse.   This information is not intended to replace advice given to you by your health care provider. Make sure you discuss any questions you have with your health care provider.   Document Released: 05/18/2008 Document Revised: 02/22/2012 Document Reviewed: 07/23/2011 Elsevier Interactive Patient Education [employer]

## 2015-12-23 NOTE — ED Provider Notes (Signed)
CSN: [id number]     Arrival date & time 12/22/15  2321 History   First MD Initiated Contact with Patient 12/22/15 2353     Chief Complaint  Patient presents with  . Arm Pain     (Consider location/radiation/quality/duration/timing/severity/associated sxs/prior Treatment) HPI  Patient reports about noon she was walking outside in her gravel driveway and she slipped on some ice and fell onto her left side landing on her left shoulder. She states she hit her head but did not have loss of consciousness. She's denies having headache now. She has pain in her left side of her neck, her left shoulder, her left elbow, and her left chest that she states hurts when she breathes deep. She denies shortness of breath. She denies any nausea or vomiting. 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 She states she took Tylenol today without relief the last dose was taken about the time of injury   PCP Dr Gerda DissLuking  Past Medical History  Diagnosis Date  . Meningitis    Past Surgical History  Procedure Laterality Date  . Cholecystectomy    . Tonsillectomy     Family History  Problem Relation Age of Onset  . Heart attack Sister   . Heart attack Maternal Aunt    Social History  Substance Use Topics  . Smoking status: Former Games developermoker  . Smokeless tobacco: None  . Alcohol Use: Yes     Comment: socially   employed  OB History    No data available     Review of Systems  All other systems reviewed and are negative.     Allergies  Doxycycline and Rocephin  Home Medications   Prior to Admission medications   Medication Sig Start Date End Date Taking? Authorizing Provider  cyclobenzaprine (FLEXERIL) 5 MG tablet Take 1 tablet (5 mg total) by mouth 3 (three) times daily as needed. 12/23/15   Devoria AlbeIva Inetta Dicke, MD  naproxen (NAPROSYN) 500 MG tablet Take 1 po BID with food prn pain 12/23/15   Devoria AlbeIva Lisa-Marie Rueger, MD   BP 113/66 mmHg  Pulse 84  Temp(Src) 98 F (36.7 C)  (Oral)  Resp 18  Ht 5\' 4"  (1.626 m)  Wt 198 lb (89.812 kg)  BMI 33.97 kg/m2  SpO2 97%  LMP 09/21/2015 (LMP Unknown)  Vital signs normal   Physical Exam  Constitutional: She is oriented to person, place, and time. She appears well-developed and well-nourished.  Non-toxic appearance. She does not appear ill. No distress.  HENT:  Head: Normocephalic and atraumatic.  Right Ear: External ear normal.  Left Ear: External ear normal.  Nose: Nose normal. No mucosal edema or rhinorrhea.  Mouth/Throat: Oropharynx is clear and moist and mucous membranes are normal. No dental abscesses or uvula swelling.  Eyes: Conjunctivae and EOM are normal. Pupils are equal, round, and reactive to light.  Neck: Normal range of motion and full passive range of motion without pain.  Patient has tenderness on the left paraspinous muscles of the cervical spine that extends into the left trapezius with pain to palpation and pain on range of motion of her head.  Cardiovascular: Normal rate, regular rhythm and normal heart sounds.  Exam reveals no gallop and no friction rub.   No murmur heard. Pulmonary/Chest: Effort normal and breath sounds normal. No respiratory distress. She has no wheezes. She has no rhonchi. She has no rales. She exhibits tenderness. She exhibits no crepitus.  Patient is  tender diffusely in her right lateral rib cage without crepitance.  Abdominal: Soft. Normal appearance and bowel sounds are normal. She exhibits no distension. There is no tenderness. There is no rebound and no guarding.  Musculoskeletal: Normal range of motion. She exhibits tenderness. She exhibits no edema.  Patient is tender in her left anterior shoulder to palpation also at the distal end of the clavicle without deformity or swelling. She has pain on abduction of her left shoulder. She has some fullness around her left elbow without definite effusion. She has pain on flexion and extension of her elbow. There is no bruising  seen. She has no pain to palpation in her left knee or thigh or lower leg.  Neurological: She is alert and oriented to person, place, and time. She has normal strength. No cranial nerve deficit.  Skin: Skin is warm, dry and intact. No rash noted. No erythema. No pallor.  Psychiatric: She has a normal mood and affect. Her speech is normal and behavior is normal. Her mood appears not anxious.  Nursing note and vitals reviewed.   ED Course  Procedures (including critical care time)  Medications  ibuprofen (ADVIL,MOTRIN) tablet 600 mg (600 mg Oral Given 12/23/15 0034)  cyclobenzaprine (FLEXERIL) tablet 10 mg (10 mg Oral Given 12/23/15 0033)   Pt was given ibuprofen and flexeril for her pain. Xrays of the painful areas were obtained and reviewed with patient. We discussed she has contusions from her fall and should improve with ice, medications and if still painful in a week she could be reevaluated by an orthopedist.     Imaging Review Dg Ribs Unilateral W/chest Left  12/23/2015  CLINICAL DATA:  Slipped and fell on ice in driveway. Severe LEFT arm pain and LEFT rib pain. EXAM: LEFT RIBS AND CHEST - 3+ VIEW COMPARISON:  Chest radiograph November 02, 2005 FINDINGS: No fracture or other bone lesions are seen involving the ribs. There is no evidence of pneumothorax or pleural effusion. Both lungs are clear. Heart size and mediastinal contours are within normal limits. Surgical clips in the included right abdomen compatible with cholecystectomy. IMPRESSION: Negative. Electronically Signed   By: Awilda Metro M.D.   On: 12/23/2015 00:39   Dg Cervical Spine Complete  12/23/2015  CLINICAL DATA:  Slipped and fell on ice in driveway. Severe LEFT arm pain. EXAM: CERVICAL SPINE - COMPLETE 4+ VIEW COMPARISON:  None. FINDINGS: Cervical vertebral bodies and posterior elements appear intact and aligned to the inferior endplate of C7, the most caudal well visualized level. Straightened cervical lordosis.  Intervertebral disc heights preserved. No destructive bony lesions. No neural foraminal narrowing. Lateral masses in alignment. Prevertebral and paraspinal soft tissue planes are nonsuspicious. IMPRESSION: Negative cervical spine radiographs. Electronically Signed   By: Awilda Metro M.D.   On: 12/23/2015 00:37   Dg Elbow Complete Left  12/23/2015  CLINICAL DATA:  Slipped and fell on ice in driveway. Severe LEFT arm pain. EXAM: LEFT ELBOW - COMPLETE 3+ VIEW COMPARISON:  None. FINDINGS: There is no evidence of fracture, dislocation, or joint effusion. There is no evidence of arthropathy or other focal bone abnormality. Soft tissues are unremarkable. IMPRESSION: Negative. Electronically Signed   By: Awilda Metro M.D.   On: 12/23/2015 00:38   Dg Shoulder Left  12/23/2015  CLINICAL DATA:  Slipped and fell on ice in driveway. Severe LEFT arm pain. EXAM: LEFT SHOULDER - 2+ VIEW COMPARISON:  None. FINDINGS: There is no evidence of fracture or dislocation. There is no evidence  of arthropathy or other focal bone abnormality. Soft tissues are unremarkable. IMPRESSION: Negative. Electronically Signed   By: Awilda Metro M.D.   On: 12/23/2015 00:36   I have personally reviewed and evaluated these images and lab results as part of my medical decision-making.   EKG Interpretation None      MDM   Final diagnoses:  Fall due to ice or snow, initial encounter  Contusion shoulder/arm, left, initial encounter  Chest wall contusion, left, initial encounter  Elbow contusion, left, initial encounter  Trapezius strain, left, initial encounter    New Prescriptions   CYCLOBENZAPRINE (FLEXERIL) 5 MG TABLET    Take 1 tablet (5 mg total) by mouth 3 (three) times daily as needed.   NAPROXEN (NAPROSYN) 500 MG TABLET    Take 1 po BID with food prn pain    Plan discharge  Devoria Albe, MD, Concha Pyo, MD 12/23/15 [phone]

## 2016-11-24 ENCOUNTER — Emergency Department (HOSPITAL_COMMUNITY)
Admission: EM | Admit: 2016-11-24 | Discharge: 2016-11-25 | Disposition: A | Discharge status: Home / Self Care | Payer: BLUE CROSS/BLUE SHIELD | Attending: Emergency Medicine | Admitting: Emergency Medicine

## 2016-11-24 ENCOUNTER — Encounter (HOSPITAL_COMMUNITY): Payer: Self-pay | Admitting: Emergency Medicine

## 2016-11-24 DIAGNOSIS — Z87891 Personal history of nicotine dependence: Secondary | ICD-10-CM | POA: Insufficient documentation

## 2016-11-24 DIAGNOSIS — R112 Nausea with vomiting, unspecified: Secondary | ICD-10-CM

## 2016-11-24 DIAGNOSIS — R197 Diarrhea, unspecified: Secondary | ICD-10-CM | POA: Insufficient documentation

## 2016-11-24 DIAGNOSIS — M545 Low back pain: Secondary | ICD-10-CM | POA: Insufficient documentation

## 2016-11-24 LAB — URINALYSIS, ROUTINE W REFLEX MICROSCOPIC
Bacteria, UA: NONE SEEN
Bilirubin Urine: NEGATIVE
Glucose, UA: NEGATIVE mg/dL
Hgb urine dipstick: NEGATIVE
Ketones, ur: NEGATIVE mg/dL
Leukocytes, UA: NEGATIVE
Nitrite: NEGATIVE
Protein, ur: NEGATIVE mg/dL
Specific Gravity, Urine: 1.019 (ref 1.005–1.030)
pH: 5 (ref 5.0–8.0)

## 2016-11-24 LAB — CBC
HEMATOCRIT: 41.3 % (ref 36.0–46.0)
HEMOGLOBIN: 14.2 g/dL (ref 12.0–15.0)
MCH: 30.5 pg (ref 26.0–34.0)
MCHC: 34.4 g/dL (ref 30.0–36.0)
MCV: 88.8 fL (ref 78.0–100.0)
PLATELETS: 253 10*3/uL (ref 150–400)
RBC: 4.65 MIL/uL (ref 3.87–5.11)
RDW: 12.3 % (ref 11.5–15.5)
WBC: 12.6 10*3/uL — ABNORMAL HIGH (ref 4.0–10.5)

## 2016-11-24 LAB — COMPREHENSIVE METABOLIC PANEL WITH GFR
ALT: 27 U/L (ref 14–54)
AST: 27 U/L (ref 15–41)
Albumin: 4.1 g/dL (ref 3.5–5.0)
Alkaline Phosphatase: 73 U/L (ref 38–126)
Anion gap: 10 (ref 5–15)
BUN: 9 mg/dL (ref 6–20)
CO2: 23 mmol/L (ref 22–32)
Calcium: 8.8 mg/dL — ABNORMAL LOW (ref 8.9–10.3)
Chloride: 100 mmol/L — ABNORMAL LOW (ref 101–111)
Creatinine, Ser: 0.59 mg/dL (ref 0.44–1.00)
GFR calc Af Amer: >60 mL/min
GFR calc non Af Amer: >60 mL/min
Glucose, Bld: 149 mg/dL — ABNORMAL HIGH (ref 65–99)
Potassium: 3.1 mmol/L — ABNORMAL LOW (ref 3.5–5.1)
Sodium: 133 mmol/L — ABNORMAL LOW (ref 135–145)
Total Bilirubin: 0.7 mg/dL (ref 0.3–1.2)
Total Protein: 7.7 g/dL (ref 6.5–8.1)

## 2016-11-24 LAB — DIFFERENTIAL
Basophils Absolute: 0 10*3/uL (ref 0.0–0.1)
Basophils Relative: 0 %
Eosinophils Absolute: 0 10*3/uL (ref 0.0–0.7)
Eosinophils Relative: 0 %
Lymphocytes Relative: 5 %
Lymphs Abs: 0.6 10*3/uL — ABNORMAL LOW (ref 0.7–4.0)
Monocytes Absolute: 0.6 10*3/uL (ref 0.1–1.0)
Monocytes Relative: 4 %
Neutro Abs: 11.2 10*3/uL — ABNORMAL HIGH (ref 1.7–7.7)
Neutrophils Relative %: 91 %

## 2016-11-24 LAB — I-STAT BETA HCG BLOOD, ED (MC, WL, AP ONLY): I-stat hCG, quantitative: <5 m[IU]/mL (ref <5)

## 2016-11-24 LAB — LIPASE, BLOOD: Lipase: 19 U/L (ref 11–51)

## 2016-11-24 MED ORDER — LOPERAMIDE HCL 2 MG PO CAPS
2.0000 mg | ORAL_CAPSULE | Freq: Once | ORAL | Status: AC
  Administered 2016-11-24: 2 mg via ORAL
  Filled 2016-11-24: qty 1

## 2016-11-24 MED ORDER — SODIUM CHLORIDE 0.9 % IV BOLUS (SEPSIS)
1000.0000 mL | Freq: Once | INTRAVENOUS | Status: AC
  Administered 2016-11-24: 1000 mL via INTRAVENOUS

## 2016-11-24 MED ORDER — ONDANSETRON HCL 4 MG PO TABS: 4.0000 mg | ORAL_TABLET | Freq: Three times a day (TID) | ORAL | 0 refills | Status: DC | PRN

## 2016-11-24 MED ORDER — LOPERAMIDE HCL 2 MG PO CAPS: 2.0000 mg | ORAL_CAPSULE | Freq: Four times a day (QID) | ORAL | 0 refills | Status: DC | PRN

## 2016-11-24 MED ORDER — ONDANSETRON HCL 4 MG/2ML IJ SOLN
4.0000 mg | Freq: Once | INTRAMUSCULAR | Status: AC
  Administered 2016-11-24: 4 mg via INTRAVENOUS
  Filled 2016-11-24: qty 2

## 2016-11-24 MED ORDER — POTASSIUM CHLORIDE CRYS ER 20 MEQ PO TBCR
40.0000 meq | EXTENDED_RELEASE_TABLET | Freq: Once | ORAL | Status: AC
  Administered 2016-11-24: 40 meq via ORAL
  Filled 2016-11-24: qty 2

## 2016-11-24 NOTE — ED Provider Notes (Signed)
[facility]-EMERGENCY DEPT Provider Note   CSN: [id number] Arrival date & time: 11/24/16  2111   By signing my name below, I, Valentino Saxon, attest that this documentation has been prepared under the direction and in the presence of Lavera Guise, MD. Electronically Signed: Valentino Saxon, ED Scribe. 11/24/16. 9:38 PM.  History   Chief Complaint Chief Complaint  Patient presents with  . Emesis   The history is provided by the patient. No language interpreter was used.   HPI Comments: Brittany [name] is a 25 y.o. female who presents to the Emergency Department complaining of moderate, constant, emesis onset this morning. Pt states she woke up at 3AM today and began having episodal emesis. She reports associated diarrhea accompanied by abdominal pain, cramping and lower back pain. Pt describes her lower back pain as if someone were "kicking" her. Pt states she is having an episode of emesis ~33min after each meal. Pt notes she will have a watery mouth before each episode of emesis. She also reports having diarrhea with every episode of emesis. Pt notes she is having some abdominal cramping after using the restroom. Pt states she had a head cold last week accompanied by a mild cough and nasal congestion, but states this has been relieved with medicine. She does note recent sick contact with coworkers. Pt notes drinking ginger ale and orange juice to help with emesis with no relief. Pt denies blood in stool. No additional complaints at this time.   Past Medical History:  Diagnosis Date  . Meningitis     There are no active problems to display for this patient.   Past Surgical History:  Procedure Laterality Date  . CHOLECYSTECTOMY    . TONSILLECTOMY      OB History    Gravida Para Term Preterm AB Living             0   SAB TAB Ectopic Multiple Live Births                   Home Medications    Prior to Admission medications   Medication Sig Start Date End Date Taking? Authorizing  Provider  loperamide (IMODIUM) 2 MG capsule Take 1 capsule (2 mg total) by mouth 4 (four) times daily as needed for diarrhea or loose stools. 11/24/16   Lavera Guise, MD  ondansetron (ZOFRAN) 4 MG tablet Take 1 tablet (4 mg total) by mouth every 8 (eight) hours as needed for nausea or vomiting. 11/24/16   Lavera Guise, MD    Family History Family History  Problem Relation Age of Onset  . Heart attack Sister   . Heart attack Maternal Aunt     Social History Social History  Substance Use Topics  . Smoking status: Former Games developer  . Smokeless tobacco: Never Used  . Alcohol use No     Allergies   Doxycycline and Rocephin [ceftriaxone]   Review of Systems Review of Systems  Gastrointestinal: Positive for abdominal pain, diarrhea and vomiting. Negative for blood in stool.  Musculoskeletal: Positive for back pain (lower).  All other systems reviewed and are negative.    Physical Exam Updated Vital Signs BP 113/70 (BP Location: Right Arm)   Pulse 105   Temp 98.2 F (36.8 C) (Oral)   Resp 22   Ht 5\' 4"  (1.626 m)   Wt 210 lb (95.3 kg)   LMP 11/10/2016   SpO2 98%   BMI 36.05 kg/m   Physical Exam Physical Exam  Nursing note and vitals reviewed. Constitutional: Well developed, well nourished, non-toxic, and in no acute distress Head: Normocephalic and atraumatic.  Mouth/Throat: Oropharynx is clear and dry mucous membranes. Neck: Normal range of motion. Neck supple.  Cardiovascular: Tachycardiac rate and regular rhythm.   Pulmonary/Chest: Effort normal and breath sounds normal.  Abdominal: Soft. There is no tenderness. There is no rebound and no guarding.  Musculoskeletal: Normal range of motion.  Neurological: Alert, no facial droop, fluent speech, moves all extremities symmetrically Skin: Skin is warm and dry.   Psychiatric: Cooperative  ED Treatments / Results   DIAGNOSTIC STUDIES: Oxygen Saturation is 97% on RA, normal by my interpretation.    COORDINATION OF  CARE: 9:29 PM Discussed treatment plan with pt at bedside which includes labs, Imodium and Zofran and pt agreed to plan.   Labs (all labs ordered are listed, but only abnormal results are displayed) Labs Reviewed  COMPREHENSIVE METABOLIC PANEL - Abnormal; Notable for the following:       Result Value   Sodium 133 (*)    Potassium 3.1 (*)    Chloride 100 (*)    Glucose, Bld 149 (*)    Calcium 8.8 (*)    All other components within normal limits  CBC - Abnormal; Notable for the following:    WBC 12.6 (*)    All other components within normal limits  URINALYSIS, ROUTINE W REFLEX MICROSCOPIC - Abnormal; Notable for the following:    APPearance HAZY (*)    All other components within normal limits  DIFFERENTIAL - Abnormal; Notable for the following:    Neutro Abs 11.2 (*)    Lymphs Abs 0.6 (*)    All other components within normal limits  LIPASE, BLOOD  I-STAT BETA HCG BLOOD, ED ([facility], [facility], [facility] ONLY)    EKG  EKG Interpretation None       Radiology No results found.  Procedures Procedures (including critical care time)  Medications Ordered in ED Medications  sodium chloride 0.9 % bolus 1,000 mL (1,000 mLs Intravenous New Bag/Given 11/24/16 2150)  sodium chloride 0.9 % bolus 1,000 mL (0 mLs Intravenous Stopped 11/24/16 2305)  ondansetron (ZOFRAN) injection 4 mg (4 mg Intravenous Given 11/24/16 2150)  loperamide (IMODIUM) capsule 2 mg (2 mg Oral Given 11/24/16 2218)  potassium chloride SA (K-DUR,KLOR-CON) CR tablet 40 mEq (40 mEq Oral Given 11/24/16 2316)     Initial Impression / Assessment and Plan / ED Course  I have reviewed the triage vital signs and the nursing notes.  Pertinent labs & imaging results that were available during my care of the patient were reviewed by me and considered in my medical decision making (see chart for details).  Clinical Course     Presenting with 1 day of nausea, vomiting, and diarrhea. She is nontoxic in no acute distress but does  appear dehydrated with tachycardia on initial presentation. With soft and benign abdomen. Blood work suggested mild dehydration including hypokalemia, mild hypernatremia and mild hyperchloremia. Minimal leukocytosis of 12 which I think is more reactive and not suggestive of serious bacterial infection. Urinalysis is not suggestive of infection and she is not pregnant. Received 2 L of IV fluids, Zofran, loperamide. Also oral potassium repletion. She does feel significantly improved and is able to keep down crackers and ginger ale here in the emergency department. Tachycardia also reports improved after her IV hydration. She feels comfortable with discharge home. Not suspicious at this time for serious surgical or infectious intra-abdominal etiology. Suspect benign GI  illness discuss continued supportive care for home. Strict return and follow-up instructions reviewed. She expressed understanding of all discharge instructions and felt comfortable with the plan of care.   Final Clinical Impressions(s) / ED Diagnoses   Final diagnoses:  Nausea vomiting and diarrhea    New Prescriptions New Prescriptions   LOPERAMIDE (IMODIUM) 2 MG CAPSULE    Take 1 capsule (2 mg total) by mouth 4 (four) times daily as needed for diarrhea or loose stools.   ONDANSETRON (ZOFRAN) 4 MG TABLET    Take 1 tablet (4 mg total) by mouth every 8 (eight) hours as needed for nausea or vomiting.   I personally performed the services described in this documentation, which was scribed in my presence. The recorded information has been reviewed and is accurate.     Lavera Guiseana Duo Selicia Windom, MD 11/24/16 [phone]

## 2016-11-24 NOTE — ED Triage Notes (Signed)
Pt reports n/v/d since 0300 today.

## 2016-11-24 NOTE — Discharge Instructions (Signed)
Your blood work was overall reassuring aside from mild dehdyration.  Take medications as prescribed.  Drink plenty of fluids. Eat a more bland diet while you are sick.  Return for worsening symptoms, including severe unremitting abdominal pain, intractable vomiting, or any other symptoms concerning to you.

## 2017-10-21 ENCOUNTER — Emergency Department (HOSPITAL_COMMUNITY)
Admission: EM | Admit: 2017-10-21 | Discharge: 2017-10-21 | Disposition: A | Discharge status: Home / Self Care | Payer: Self-pay | Attending: Emergency Medicine | Admitting: Emergency Medicine

## 2017-10-21 ENCOUNTER — Encounter (HOSPITAL_COMMUNITY): Payer: Self-pay | Admitting: Emergency Medicine

## 2017-10-21 ENCOUNTER — Other Ambulatory Visit: Payer: Self-pay

## 2017-10-21 DIAGNOSIS — N921 Excessive and frequent menstruation with irregular cycle: Secondary | ICD-10-CM | POA: Insufficient documentation

## 2017-10-21 DIAGNOSIS — Z87891 Personal history of nicotine dependence: Secondary | ICD-10-CM | POA: Insufficient documentation

## 2017-10-21 LAB — POC URINE PREG, ED: Preg Test, Ur: NEGATIVE

## 2017-10-21 MED ORDER — ACETAMINOPHEN 325 MG PO TABS
650.0000 mg | ORAL_TABLET | Freq: Once | ORAL | Status: AC
  Administered 2017-10-21: 650 mg via ORAL
  Filled 2017-10-21: qty 2

## 2017-10-21 NOTE — ED Provider Notes (Signed)
[facility] Provider Note   CSN: [id number] Arrival date & time: 10/21/17  [id number]     History   Chief Complaint Chief Complaint  Patient presents with  . Menorrhagia    HPI Brittany [name] is a 26 y.o. female.  The history is provided by the patient.  Vaginal Bleeding  Primary symptoms include vaginal bleeding. This is a new problem. The current episode started more than 2 days ago. The problem occurs daily. The problem has been gradually worsening. Associated symptoms include abdominal pain. Pertinent negatives include no vomiting and no dizziness. She has tried nothing for the symptoms.  pt reports increasing heavy menstrual bleeding, and also reports lower abdominal cramping  Past Medical History:  Diagnosis Date  . Meningitis     There are no active problems to display for this patient.   Past Surgical History:  Procedure Laterality Date  . CHOLECYSTECTOMY    . TONSILLECTOMY      OB History    Gravida Para Term Preterm AB Living             0   SAB TAB Ectopic Multiple Live Births                   Home Medications    Prior to Admission medications   Medication Sig Start Date End Date Taking? Authorizing Provider  loperamide (IMODIUM) 2 MG capsule Take 1 capsule (2 mg total) by mouth 4 (four) times daily as needed for diarrhea or loose stools. 11/24/16   Lavera GuiseLiu, Dana Duo, MD  ondansetron (ZOFRAN) 4 MG tablet Take 1 tablet (4 mg total) by mouth every 8 (eight) hours as needed for nausea or vomiting. 11/24/16   Lavera GuiseLiu, Dana Duo, MD    Family History Family History  Problem Relation Age of Onset  . Heart attack Sister   . Heart attack Maternal Aunt     Social History Social History   Tobacco Use  . Smoking status: Former Games developermoker  . Smokeless tobacco: Never Used  Substance Use Topics  . Alcohol use: No  . Drug use: No    Comment: pt denies     Allergies   Doxycycline and Rocephin [ceftriaxone]   Review of Systems Review of  Systems  Constitutional: Negative for fever.  Gastrointestinal: Positive for abdominal pain. Negative for vomiting.  Genitourinary: Positive for vaginal bleeding.  Neurological: Negative for dizziness.  All other systems reviewed and are negative.    Physical Exam Updated Vital Signs BP 125/68 (BP Location: Left Arm)   Pulse 76   Temp 97.9 F (36.6 C) (Oral)   Resp 18   Ht 1.626 m (5\' 4" )   Wt 86.2 kg (190 lb)   LMP 10/15/2017   SpO2 96%   BMI 32.61 kg/m   Physical Exam CONSTITUTIONAL: Well developed/well nourished HEAD: Normocephalic/atraumatic EYES: EOMI/PERRL ENMT: Mucous membranes moist NECK: supple no meningeal signs SPINE/BACK:entire spine nontender CV: S1/S2 noted, no murmurs/rubs/gallops noted LUNGS: Lungs are clear to auscultation bilaterally, no apparent distress ABDOMEN: soft, nontender, no rebound or guarding, bowel sounds noted throughout abdomen GU:no cva tenderness , small amt of vag bleeding noted, no lacerations noted, no CMT.  Nurse chaperone present for exam NEURO: Pt is awake/alert/appropriate, moves all extremitiesx4.  No facial droop.   EXTREMITIES: pulses normal/equal, full ROM SKIN: warm, color normal PSYCH: no abnormalities of mood noted, alert and oriented to situation   ED Treatments / Results  Labs (all labs ordered are listed, but only  abnormal results are displayed) Labs Reviewed  POC URINE PREG, ED    EKG  EKG Interpretation None       Radiology No results found.  Procedures Procedures (including critical care time)  Medications Ordered in ED Medications  acetaminophen (TYLENOL) tablet 650 mg (not administered)     Initial Impression / Assessment and Plan / ED Course  I have reviewed the triage vital signs and the nursing notes.  Pertinent labs  results that were available during my care of the patient were reviewed by me and considered in my medical decision making (see chart for details).     Pt stable Bleeding  is controlled She is well appearing, she can f/u with GYN as outpatient   Final Clinical Impressions(s) / ED Diagnoses   Final diagnoses:  Menometrorrhagia    ED Discharge Orders    None       Zadie RhineWickline, Vaniya Augspurger, MD 10/21/17 0401

## 2017-10-21 NOTE — ED Triage Notes (Signed)
Patient has heavy menstrual bleeding that started a week ago, flow is normal not very heavy only lasting a couple of days.

## 2018-08-21 ENCOUNTER — Emergency Department (HOSPITAL_COMMUNITY)
Admission: EM | Admit: 2018-08-21 | Discharge: 2018-08-21 | Disposition: A | Discharge status: Home / Self Care | Payer: Self-pay | Attending: Emergency Medicine | Admitting: Emergency Medicine

## 2018-08-21 ENCOUNTER — Encounter (HOSPITAL_COMMUNITY): Payer: Self-pay | Admitting: Emergency Medicine

## 2018-08-21 ENCOUNTER — Emergency Department (HOSPITAL_COMMUNITY): Payer: Self-pay

## 2018-08-21 ENCOUNTER — Other Ambulatory Visit: Payer: Self-pay

## 2018-08-21 DIAGNOSIS — R197 Diarrhea, unspecified: Secondary | ICD-10-CM | POA: Insufficient documentation

## 2018-08-21 DIAGNOSIS — R112 Nausea with vomiting, unspecified: Secondary | ICD-10-CM | POA: Insufficient documentation

## 2018-08-21 DIAGNOSIS — Z87891 Personal history of nicotine dependence: Secondary | ICD-10-CM | POA: Insufficient documentation

## 2018-08-21 DIAGNOSIS — Z9049 Acquired absence of other specified parts of digestive tract: Secondary | ICD-10-CM | POA: Insufficient documentation

## 2018-08-21 LAB — CBC WITH DIFFERENTIAL/PLATELET
Basophils Absolute: 0 10*3/uL (ref 0.0–0.1)
Basophils Relative: 0 %
EOS PCT: 3 %
Eosinophils Absolute: 0.3 10*3/uL (ref 0.0–0.7)
HEMATOCRIT: 42.7 % (ref 36.0–46.0)
Hemoglobin: 14.8 g/dL (ref 12.0–15.0)
LYMPHS PCT: 32 %
Lymphs Abs: 3.5 10*3/uL (ref 0.7–4.0)
MCH: 32.1 pg (ref 26.0–34.0)
MCHC: 34.7 g/dL (ref 30.0–36.0)
MCV: 92.6 fL (ref 78.0–100.0)
MONO ABS: 0.6 10*3/uL (ref 0.1–1.0)
MONOS PCT: 5 %
NEUTROS ABS: 6.6 10*3/uL (ref 1.7–7.7)
Neutrophils Relative %: 60 %
PLATELETS: 261 10*3/uL (ref 150–400)
RBC: 4.61 MIL/uL (ref 3.87–5.11)
RDW: 12.4 % (ref 11.5–15.5)
WBC: 11 10*3/uL — ABNORMAL HIGH (ref 4.0–10.5)

## 2018-08-21 LAB — URINALYSIS, ROUTINE W REFLEX MICROSCOPIC
BACTERIA UA: NONE SEEN
Bilirubin Urine: NEGATIVE
Glucose, UA: NEGATIVE mg/dL
Ketones, ur: NEGATIVE mg/dL
Nitrite: NEGATIVE
PROTEIN: NEGATIVE mg/dL
SPECIFIC GRAVITY, URINE: 1.021 (ref 1.005–1.030)
pH: 5 (ref 5.0–8.0)

## 2018-08-21 LAB — COMPREHENSIVE METABOLIC PANEL
ALBUMIN: 4.3 g/dL (ref 3.5–5.0)
ALT: 19 U/L (ref 0–44)
AST: 19 U/L (ref 15–41)
Alkaline Phosphatase: 60 U/L (ref 38–126)
Anion gap: 10 (ref 5–15)
BILIRUBIN TOTAL: 0.7 mg/dL (ref 0.3–1.2)
BUN: 9 mg/dL (ref 6–20)
CHLORIDE: 103 mmol/L (ref 98–111)
CO2: 26 mmol/L (ref 22–32)
CREATININE: 0.66 mg/dL (ref 0.44–1.00)
Calcium: 8.6 mg/dL — ABNORMAL LOW (ref 8.9–10.3)
GFR calc Af Amer: >60 mL/min (ref >60)
GLUCOSE: 103 mg/dL — AB (ref 70–99)
POTASSIUM: 3.3 mmol/L — AB (ref 3.5–5.1)
Sodium: 139 mmol/L (ref 135–145)
Total Protein: 7.5 g/dL (ref 6.5–8.1)

## 2018-08-21 LAB — LIPASE, BLOOD: LIPASE: 26 U/L (ref 11–51)

## 2018-08-21 LAB — PREGNANCY, URINE: Preg Test, Ur: NEGATIVE

## 2018-08-21 MED ORDER — FAMOTIDINE IN NACL 20-0.9 MG/50ML-% IV SOLN
20.0000 mg | Freq: Once | INTRAVENOUS | Status: AC
  Administered 2018-08-21: 20 mg via INTRAVENOUS
  Filled 2018-08-21: qty 50

## 2018-08-21 MED ORDER — ONDANSETRON 4 MG PO TBDP: 4.0000 mg | ORAL_TABLET | Freq: Three times a day (TID) | ORAL | 0 refills | Status: AC | PRN

## 2018-08-21 MED ORDER — DICYCLOMINE HCL 10 MG/ML IM SOLN
20.0000 mg | Freq: Once | INTRAMUSCULAR | Status: AC
  Administered 2018-08-21: 20 mg via INTRAMUSCULAR
  Filled 2018-08-21: qty 2

## 2018-08-21 MED ORDER — SODIUM CHLORIDE 0.9 % IV BOLUS
1000.0000 mL | Freq: Once | INTRAVENOUS | Status: AC
  Administered 2018-08-21: 1000 mL via INTRAVENOUS

## 2018-08-21 MED ORDER — ONDANSETRON HCL 4 MG/2ML IJ SOLN
4.0000 mg | INTRAMUSCULAR | Status: DC | PRN
  Administered 2018-08-21: 4 mg via INTRAVENOUS
  Filled 2018-08-21: qty 2

## 2018-08-21 MED ORDER — POTASSIUM CHLORIDE CRYS ER 20 MEQ PO TBCR
40.0000 meq | EXTENDED_RELEASE_TABLET | Freq: Once | ORAL | Status: AC
  Administered 2018-08-21: 40 meq via ORAL
  Filled 2018-08-21: qty 2

## 2018-08-21 MED ORDER — DICYCLOMINE HCL 20 MG PO TABS: 20.0000 mg | ORAL_TABLET | Freq: Four times a day (QID) | ORAL | 0 refills | Status: AC | PRN

## 2018-08-21 NOTE — ED Triage Notes (Signed)
Pt reports n/v since yesterday around noon and diarrhea starting this morning.  Has vomited 3 times and 3 episodes of diarrhea since 5 am.

## 2018-08-21 NOTE — ED Notes (Signed)
Pt returned from xray

## 2018-08-21 NOTE — Discharge Instructions (Addendum)
Take the prescriptions as directed.  Increase your fluid intake (ie:  Gatoraide) for the next few days, as discussed.  Eat a bland diet and advance to your regular diet slowly as you can tolerate it.   Avoid full strength juices, as well as milk and milk products until your diarrhea has resolved.   Call your regular medical doctor Monday to schedule a follow up appointment this week.  Return to the Emergency Department immediately if not improving (or even worsening) despite taking the medicines as prescribed, any black or bloody stool or vomit, if you develop a fever over "101," or for any other concerns.

## 2018-08-21 NOTE — ED Provider Notes (Signed)
[facility] EMERGENCY DEPARTMENT Provider Note   CSN: [id number] Arrival date & time: 08/21/18  0734     History   Chief Complaint Chief Complaint  Patient presents with  . Emesis  . Diarrhea    HPI Brittany [name] is a 27 y.o. female.  HPI  Pt was seen at 0800. Per pt, c/o gradual onset and persistence of multiple intermittent episodes of N/V/D that began overnight last night.   Describes the stools as "watery."  Has been associated with generalized abd "cramping." Denies abd pain, no CP/SOB, no back pain, no fevers, no black or blood in stools or emesis, no sick contacts.    Past Medical History:  Diagnosis Date  . Meningitis     There are no active problems to display for this patient.   Past Surgical History:  Procedure Laterality Date  . CHOLECYSTECTOMY    . TONSILLECTOMY       OB History    Gravida      Para      Term      Preterm      AB      Living  0     SAB      TAB      Ectopic      Multiple      Live Births               Home Medications    Prior to Admission medications   Medication Sig Start Date End Date Taking? Authorizing Provider  loperamide (IMODIUM) 2 MG capsule Take 1 capsule (2 mg total) by mouth 4 (four) times daily as needed for diarrhea or loose stools. 11/24/16   Lavera Guise, MD  ondansetron (ZOFRAN) 4 MG tablet Take 1 tablet (4 mg total) by mouth every 8 (eight) hours as needed for nausea or vomiting. 11/24/16   Lavera Guise, MD    Family History Family History  Problem Relation Age of Onset  . Heart attack Sister   . Heart attack Maternal Aunt     Social History Social History   Tobacco Use  . Smoking status: Former Games developer  . Smokeless tobacco: Never Used  Substance Use Topics  . Alcohol use: No  . Drug use: Yes    Types: Marijuana    Comment: occasional     Allergies   Doxycycline; Rocephin [ceftriaxone]; and Tylenol [acetaminophen]   Review of Systems Review of Systems ROS:  Statement: All systems negative except as marked or noted in the HPI; Constitutional: Negative for fever and chills. ; ; Eyes: Negative for eye pain, redness and discharge. ; ; ENMT: Negative for ear pain, hoarseness, nasal congestion, sinus pressure and sore throat. ; ; Cardiovascular: Negative for chest pain, palpitations, diaphoresis, dyspnea and peripheral edema. ; ; Respiratory: Negative for cough, wheezing and stridor. ; ; Gastrointestinal: +N/V/D. Negative for abdominal pain, blood in stool, hematemesis, jaundice and rectal bleeding. . ; ; Genitourinary: Negative for dysuria, flank pain and hematuria. ; ; Musculoskeletal: Negative for back pain and neck pain. Negative for swelling and trauma.; ; Skin: Negative for pruritus, rash, abrasions, blisters, bruising and skin lesion.; ; Neuro: Negative for headache, lightheadedness and neck stiffness. Negative for weakness, altered level of consciousness, altered mental status, extremity weakness, paresthesias, involuntary movement, seizure and syncope.       Physical Exam Updated Vital Signs BP 118/73   Pulse 100   Temp 97.9 F (36.6 C) (Oral)   Resp 17   Ht  5\' 4"  (1.626 m)   Wt 95.3 kg   LMP 08/18/2018   SpO2 96%   BMI 36.05 kg/m   Physical Exam 0805: Physical examination:  Nursing notes reviewed; Vital signs and O2 SAT reviewed;  Constitutional: Well developed, Well nourished, Well hydrated, In no acute distress; Head:  Normocephalic, atraumatic; Eyes: EOMI, PERRL, No scleral icterus; ENMT: Mouth and pharynx normal, Mucous membranes moist; Neck: Supple, Full range of motion, No lymphadenopathy; Cardiovascular: Regular rate and rhythm, No gallop; Respiratory: Breath sounds clear & equal bilaterally, No wheezes.  Speaking full sentences with ease, Normal respiratory effort/excursion; Chest: Nontender, Movement normal; Abdomen: Soft, +mild diffuse tenderness to palp. No rebound or guarding. Nondistended, Normal bowel sounds; Genitourinary: No CVA  tenderness; Extremities: Peripheral pulses normal, No tenderness, No edema, No calf edema or asymmetry.; Neuro: AA&Ox3, Major CN grossly intact.  Speech clear. No gross focal motor or sensory deficits in extremities.; Skin: Color normal, Warm, Dry.    ED Treatments / Results  Labs (all labs ordered are listed, but only abnormal results are displayed)   EKG None  Radiology   Procedures Procedures (including critical care time)  Medications Ordered in ED Medications  sodium chloride 0.9 % bolus 1,000 mL (1,000 mLs Intravenous New Bag/Given 08/21/18 0824)  ondansetron (ZOFRAN) injection 4 mg (4 mg Intravenous Given 08/21/18 0825)  famotidine (PEPCID) IVPB 20 mg premix (20 mg Intravenous New Bag/Given 08/21/18 0824)  dicyclomine (BENTYL) injection 20 mg (20 mg Intramuscular Given 08/21/18 0825)     Initial Impression / Assessment and Plan / ED Course  I have reviewed the triage vital signs and the nursing notes.  Pertinent labs & imaging results that were available during my care of the patient were reviewed by me and considered in my medical decision making (see chart for details).  MDM Reviewed: previous chart, nursing note and vitals Reviewed previous: labs Interpretation: labs and x-ray   Results for orders placed or performed during the hospital encounter of 08/21/18  Comprehensive metabolic panel  Result Value Ref Range   Sodium 139 135 - 145 mmol/L   Potassium 3.3 (L) 3.5 - 5.1 mmol/L   Chloride 103 98 - 111 mmol/L   CO2 26 22 - 32 mmol/L   Glucose, Bld 103 (H) 70 - 99 mg/dL   BUN 9 6 - 20 mg/dL   Creatinine, Ser 4.80 0.44 - 1.00 mg/dL   Calcium 8.6 (L) 8.9 - 10.3 mg/dL   Total Protein 7.5 6.5 - 8.1 g/dL   Albumin 4.3 3.5 - 5.0 g/dL   AST 19 15 - 41 U/L   ALT 19 0 - 44 U/L   Alkaline Phosphatase 60 38 - 126 U/L   Total Bilirubin 0.7 0.3 - 1.2 mg/dL   GFR calc non Af Amer >60 >60 mL/min   GFR calc Af Amer >60 >60 mL/min   Anion gap 10 5 - 15  Lipase, blood  Result  Value Ref Range   Lipase 26 11 - 51 U/L  CBC with Differential  Result Value Ref Range   WBC 11.0 (H) 4.0 - 10.5 K/uL   RBC 4.61 3.87 - 5.11 MIL/uL   Hemoglobin 14.8 12.0 - 15.0 g/dL   HCT 16.5 53.7 - 48.2 %   MCV 92.6 78.0 - 100.0 fL   MCH 32.1 26.0 - 34.0 pg   MCHC 34.7 30.0 - 36.0 g/dL   RDW 70.7 86.7 - 54.4 %   Platelets 261 150 - 400 K/uL   Neutrophils Relative %  60 %   Neutro Abs 6.6 1.7 - 7.7 K/uL   Lymphocytes Relative 32 %   Lymphs Abs 3.5 0.7 - 4.0 K/uL   Monocytes Relative 5 %   Monocytes Absolute 0.6 0.1 - 1.0 K/uL   Eosinophils Relative 3 %   Eosinophils Absolute 0.3 0.0 - 0.7 K/uL   Basophils Relative 0 %   Basophils Absolute 0.0 0.0 - 0.1 K/uL  Pregnancy, urine  Result Value Ref Range   Preg Test, Ur NEGATIVE NEGATIVE  Urinalysis, Routine w reflex microscopic  Result Value Ref Range   Color, Urine YELLOW YELLOW   APPearance HAZY (A) CLEAR   Specific Gravity, Urine 1.021 1.005 - 1.030   pH 5.0 5.0 - 8.0   Glucose, UA NEGATIVE NEGATIVE mg/dL   Hgb urine dipstick MODERATE (A) NEGATIVE   Bilirubin Urine NEGATIVE NEGATIVE   Ketones, ur NEGATIVE NEGATIVE mg/dL   Protein, ur NEGATIVE NEGATIVE mg/dL   Nitrite NEGATIVE NEGATIVE   Leukocytes, UA TRACE (A) NEGATIVE   RBC / HPF 0-5 0 - 5 RBC/hpf   WBC, UA 6-10 0 - 5 WBC/hpf   Bacteria, UA NONE SEEN NONE SEEN   Squamous Epithelial / LPF 11-20 0 - 5   Mucus PRESENT    Dg Abd Acute W/chest Result Date: 08/21/2018 CLINICAL DATA:  27 year old female with a history of nausea vomiting and diarrhea EXAM: DG ABDOMEN ACUTE W/ 1V CHEST COMPARISON:  12/23/2015 FINDINGS: Chest: Cardiomediastinal silhouette unchanged in size and contour. No evidence of central vascular congestion. No pneumothorax or pleural effusion. No confluent airspace disease. Abdomen: Gas within stomach, small bowel, colon. No abnormal distention. Gas extends to the rectum. Mild stool burden. Radiopaque focus projecting over the fifth lumbar vertebral body  favored to be external to the patient. No unexpected calcification.  No unexpected soft tissue density. IMPRESSION: Chest: Negative for acute cardiopulmonary disease. Abdomen: Negative Electronically Signed   By: Gilmer Mor D.O.   On: 08/21/2018 09:17     1045:  Potassium repleted PO. Udip appears contaminated and pt without urinary complaints. Pt has tol PO well while in the ED without N/V.  No stooling while in the ED.  Abd remains benign, VSS. Feels better and wants to go home now. Dx and testing d/w pt.  Questions answered.  Verb understanding, agreeable to d/c home with outpt f/u.    Final Clinical Impressions(s) / ED Diagnoses   Final diagnoses:  None    ED Discharge Orders    None       Samuel Jester, DO 08/23/18 1015

## 2019-01-14 IMAGING — DX DG ABDOMEN ACUTE W/ 1V CHEST
3 series · 3 of 3 positions shown · non-contrast
Comparison: 12/23/2015

CLINICAL DATA: 27-year-old female with a history of nausea vomiting
and diarrhea

EXAM:
DG ABDOMEN ACUTE W/ 1V CHEST

[chest pa]
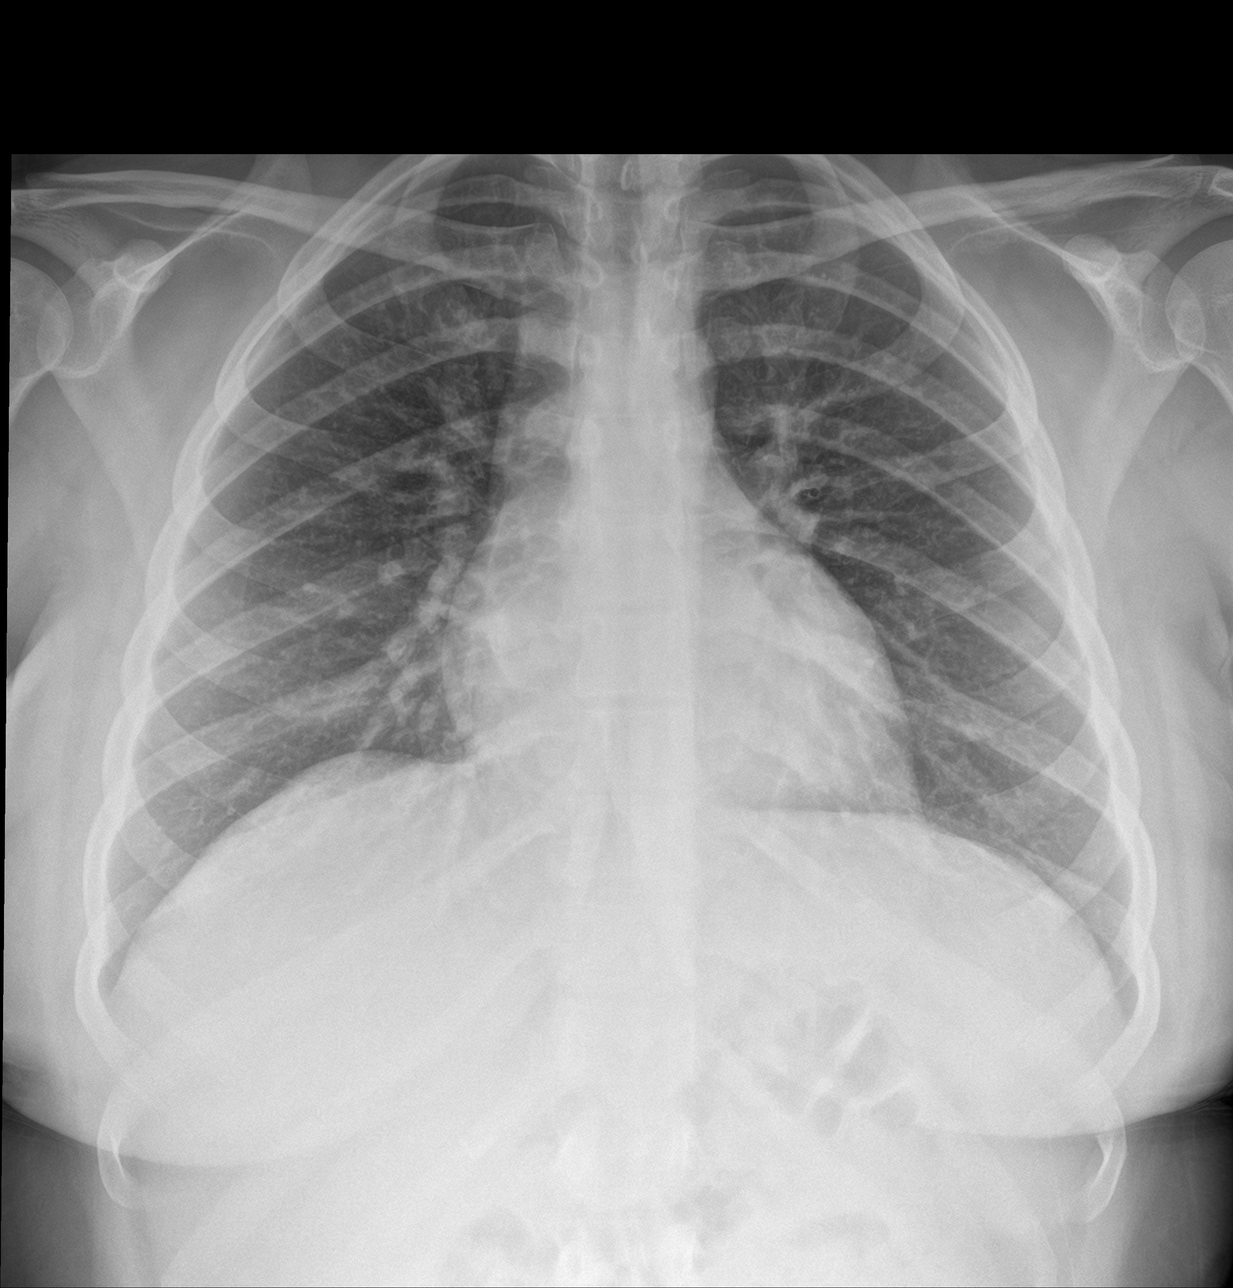

[abdomen erect]
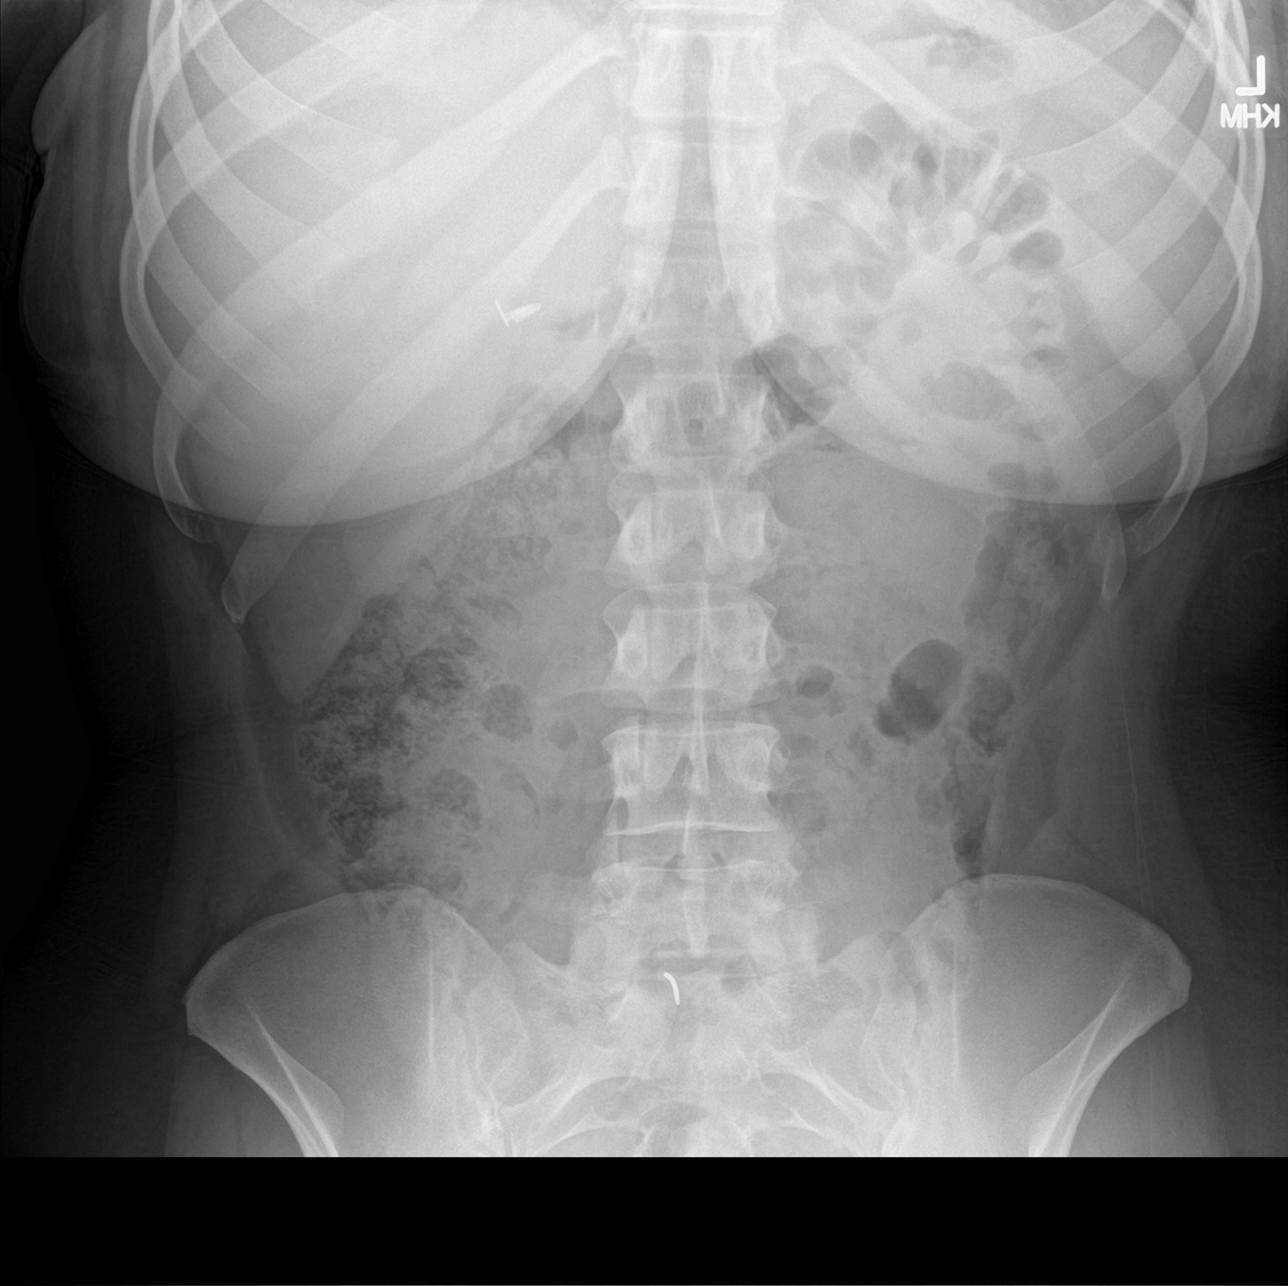

[abdomen supine]
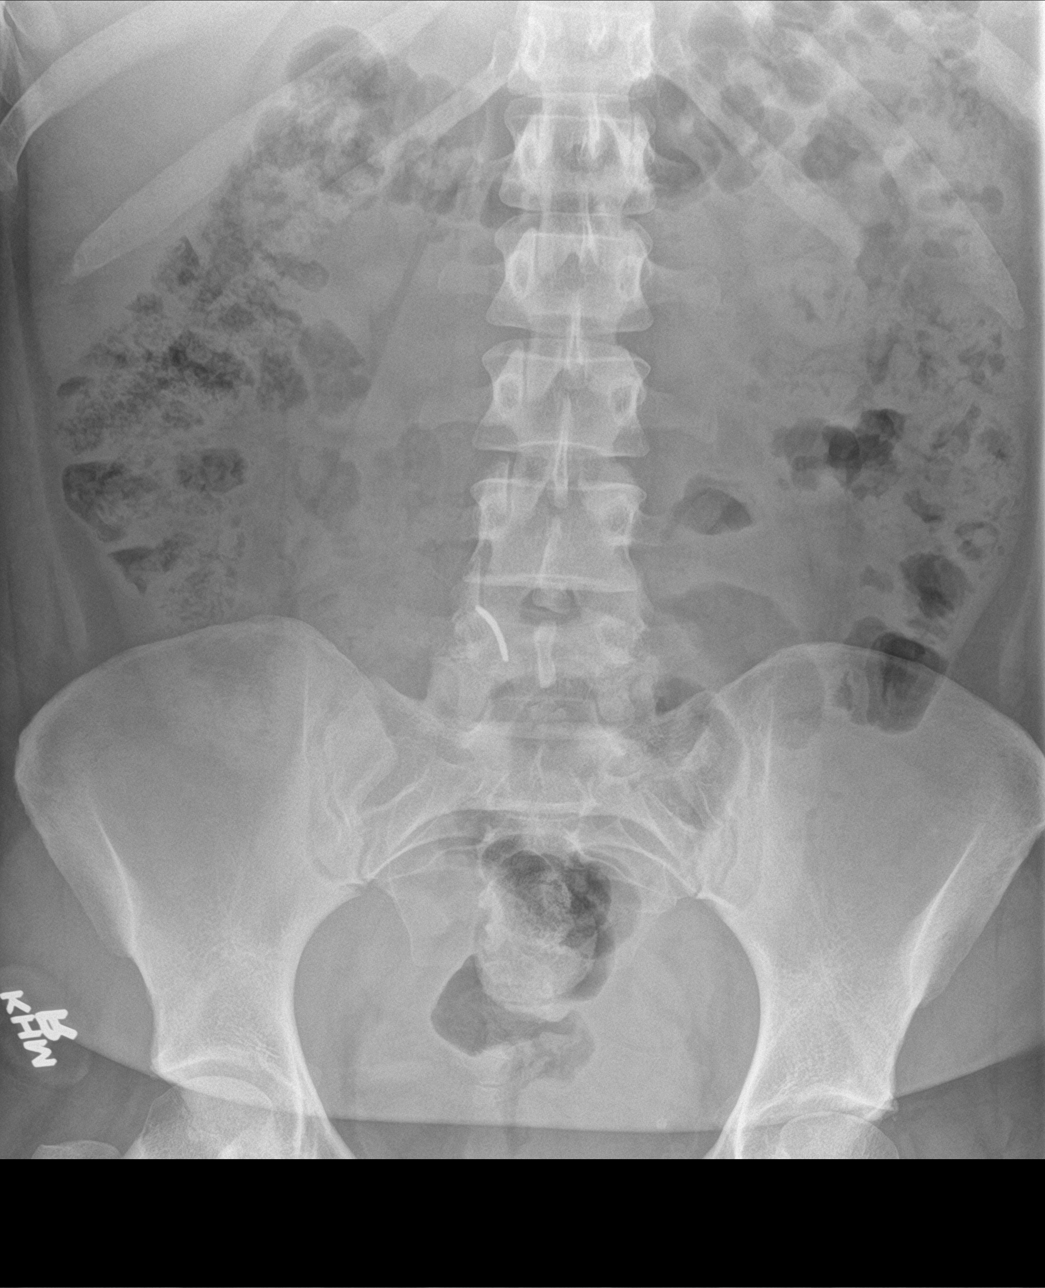

[3 of 3 positions shown; findings below may reference images not displayed]

FINDINGS: Chest:

Cardiomediastinal silhouette unchanged in size and contour. No
evidence of central vascular congestion. No pneumothorax or pleural
effusion. No confluent airspace disease.

Abdomen:

Gas within stomach, small bowel, colon. No abnormal distention. Gas
extends to the rectum. Mild stool burden.

Radiopaque focus projecting over the fifth lumbar vertebral body
favored to be external to the patient.

No unexpected calcification.  No unexpected soft tissue density.
IMPRESSION: Chest:

Negative for acute cardiopulmonary disease.

Abdomen:

Negative

## 2023-10-24 ENCOUNTER — Emergency Department (HOSPITAL_COMMUNITY): Payer: Self-pay

## 2023-10-24 ENCOUNTER — Encounter (HOSPITAL_COMMUNITY): Payer: Self-pay

## 2023-10-24 ENCOUNTER — Emergency Department (HOSPITAL_COMMUNITY)
Admission: EM | Admit: 2023-10-24 | Discharge: 2023-10-24 | Disposition: A | Discharge status: Home / Self Care | Payer: Self-pay | Attending: Emergency Medicine | Admitting: Emergency Medicine

## 2023-10-24 ENCOUNTER — Other Ambulatory Visit: Payer: Self-pay

## 2023-10-24 DIAGNOSIS — S161XXA Strain of muscle, fascia and tendon at neck level, initial encounter: Secondary | ICD-10-CM | POA: Insufficient documentation

## 2023-10-24 DIAGNOSIS — Y92410 Unspecified street and highway as the place of occurrence of the external cause: Secondary | ICD-10-CM | POA: Diagnosis not present

## 2023-10-24 DIAGNOSIS — S199XXA Unspecified injury of neck, initial encounter: Secondary | ICD-10-CM | POA: Diagnosis present

## 2023-10-24 DIAGNOSIS — E041 Nontoxic single thyroid nodule: Secondary | ICD-10-CM | POA: Diagnosis not present

## 2023-10-24 DIAGNOSIS — M546 Pain in thoracic spine: Secondary | ICD-10-CM | POA: Insufficient documentation

## 2023-10-24 MED ORDER — NAPROXEN 500 MG PO TABS: 500.0000 mg | ORAL_TABLET | Freq: Two times a day (BID) | ORAL | 0 refills | Status: AC

## 2023-10-24 MED ORDER — KETOROLAC TROMETHAMINE 10 MG PO TABS
10.0000 mg | ORAL_TABLET | Freq: Once | ORAL | Status: AC
  Administered 2023-10-24: 10 mg via ORAL
  Filled 2023-10-24: qty 1

## 2023-10-24 MED ORDER — METHOCARBAMOL 500 MG PO TABS: 500.0000 mg | ORAL_TABLET | Freq: Four times a day (QID) | ORAL | 0 refills | Status: AC | PRN
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
## 2023-10-24 NOTE — ED Notes (Signed)
Pt returned from CT °
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
## 2023-10-24 NOTE — ED Notes (Signed)
Patient transported to CT 

## 2024-06-08 ENCOUNTER — Emergency Department (HOSPITAL_COMMUNITY): Payer: Self-pay

## 2024-06-08 ENCOUNTER — Encounter (HOSPITAL_COMMUNITY): Payer: Self-pay

## 2024-06-08 ENCOUNTER — Emergency Department (HOSPITAL_COMMUNITY)
Admission: EM | Admit: 2024-06-08 | Discharge: 2024-06-08 | Disposition: A | Discharge status: Home / Self Care | Payer: Self-pay | Attending: Student | Admitting: Student

## 2024-06-08 ENCOUNTER — Other Ambulatory Visit: Payer: Self-pay

## 2024-06-08 DIAGNOSIS — W231XXA Caught, crushed, jammed, or pinched between stationary objects, initial encounter: Secondary | ICD-10-CM | POA: Insufficient documentation

## 2024-06-08 DIAGNOSIS — S6702XA Crushing injury of left thumb, initial encounter: Secondary | ICD-10-CM | POA: Insufficient documentation

## 2024-06-08 MED ORDER — IBUPROFEN 600 MG PO TABS: 600.0000 mg | ORAL_TABLET | Freq: Four times a day (QID) | ORAL | 0 refills | Status: AC | PRN

## 2024-06-08 MED ORDER — TRAMADOL HCL 50 MG PO TABS: 50.0000 mg | ORAL_TABLET | Freq: Four times a day (QID) | ORAL | 0 refills | Status: AC | PRN
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
## 2024-06-08 NOTE — Discharge Instructions (Signed)
 I recommend ice and elevation is much as possible for the next 2 days this will help reduce pain and deep tissue swelling.  I have also prescribed you an anti-inflammatory pain reliever along with tramadol which is purely a pain reliever but can make you drowsy so use caution when taking this medication, specifically do not drive within 4 hours of taking tramadol.  You may find the tramadol most helpful at night when you are trying to sleep.  Wear the splint provided to help protect your thumb.
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
